# Patient Record
Sex: Female | Born: 1955 | Race: Black or African American | Hispanic: No | Marital: Married | Smoking: Never smoker
Health system: Southern US, Community
[De-identification: ages and names within clinical notes are randomized; demographics above are authoritative.]

## PROBLEM LIST (undated history)

## (undated) DIAGNOSIS — R42 Dizziness and giddiness: Secondary | ICD-10-CM

## (undated) DIAGNOSIS — I10 Essential (primary) hypertension: Secondary | ICD-10-CM

## (undated) DIAGNOSIS — E119 Type 2 diabetes mellitus without complications: Secondary | ICD-10-CM

## (undated) HISTORY — PX: HYSTERECTOMY ABDOMINAL WITH SALPINGO-OOPHORECTOMY: SHX6792

---

## 2003-08-25 ENCOUNTER — Ambulatory Visit (HOSPITAL_COMMUNITY): Admission: RE | Admit: 2003-08-25 | Discharge: 2003-08-25 | Payer: Self-pay | Admitting: *Deleted

## 2003-11-05 ENCOUNTER — Other Ambulatory Visit: Admission: RE | Admit: 2003-11-05 | Discharge: 2003-11-05 | Payer: Self-pay | Admitting: Family Medicine

## 2006-03-06 ENCOUNTER — Ambulatory Visit: Payer: Self-pay | Admitting: Family Medicine

## 2006-04-04 ENCOUNTER — Ambulatory Visit: Payer: Self-pay | Admitting: Family Medicine

## 2006-07-04 ENCOUNTER — Ambulatory Visit: Payer: Self-pay | Admitting: Family Medicine

## 2006-07-09 ENCOUNTER — Ambulatory Visit: Payer: Self-pay | Admitting: Family Medicine

## 2006-07-09 LAB — CONVERTED CEMR LAB
BUN: 9 mg/dL (ref 6–23)
Calcium: 9.2 mg/dL (ref 8.4–10.5)
Creatinine, Ser: 0.9 mg/dL (ref 0.4–1.2)
Glucose, Bld: 93 mg/dL (ref 70–99)
Potassium: 3.9 meq/L (ref 3.5–5.1)

## 2006-07-17 ENCOUNTER — Ambulatory Visit: Payer: Self-pay | Admitting: Family Medicine

## 2006-08-07 ENCOUNTER — Ambulatory Visit: Payer: Self-pay | Admitting: Family Medicine

## 2006-08-07 LAB — CONVERTED CEMR LAB
AST: 21 units/L (ref 0–37)
BUN: 11 mg/dL (ref 6–23)
Chol/HDL Ratio, serum: 6.3
Cholesterol: 218 mg/dL (ref 0–200)
GFR calc non Af Amer: 63 mL/min
Glucose, Bld: 118 mg/dL — ABNORMAL HIGH (ref 70–99)
HDL: 34.4 mg/dL — ABNORMAL LOW (ref >39.0)
Potassium: 3.7 meq/L (ref 3.5–5.1)
Sodium: 138 meq/L (ref 135–145)
Triglyceride fasting, serum: 104 mg/dL (ref 0–149)
VLDL: 21 mg/dL (ref 0–40)

## 2006-09-18 DIAGNOSIS — I10 Essential (primary) hypertension: Secondary | ICD-10-CM | POA: Insufficient documentation

## 2006-09-24 ENCOUNTER — Ambulatory Visit: Payer: Self-pay | Admitting: Family Medicine

## 2006-10-10 ENCOUNTER — Encounter: Admission: RE | Admit: 2006-10-10 | Discharge: 2007-01-08 | Payer: Self-pay | Admitting: Family Medicine

## 2006-11-21 ENCOUNTER — Ambulatory Visit: Payer: Self-pay | Admitting: Family Medicine

## 2006-11-21 LAB — CONVERTED CEMR LAB
AST: 14 units/L (ref 0–37)
Glucose, Bld: 102 mg/dL — ABNORMAL HIGH (ref 70–99)
HDL: 34.3 mg/dL — ABNORMAL LOW (ref >39.0)
Total CHOL/HDL Ratio: 5.2
Triglycerides: 170 mg/dL — ABNORMAL HIGH (ref 0–149)
VLDL: 34 mg/dL (ref 0–40)

## 2006-12-25 ENCOUNTER — Ambulatory Visit: Payer: Self-pay | Admitting: Family Medicine

## 2007-01-25 ENCOUNTER — Emergency Department (HOSPITAL_COMMUNITY): Admission: EM | Admit: 2007-01-25 | Discharge: 2007-01-25 | Payer: Self-pay | Admitting: Emergency Medicine

## 2007-03-27 ENCOUNTER — Ambulatory Visit: Payer: Self-pay | Admitting: Family Medicine

## 2007-03-27 DIAGNOSIS — E785 Hyperlipidemia, unspecified: Secondary | ICD-10-CM | POA: Insufficient documentation

## 2007-03-28 ENCOUNTER — Telehealth (INDEPENDENT_AMBULATORY_CARE_PROVIDER_SITE_OTHER): Payer: Self-pay | Admitting: *Deleted

## 2007-03-28 LAB — CONVERTED CEMR LAB
BUN: 11 mg/dL (ref 6–23)
Chloride: 106 meq/L (ref 96–112)
Cholesterol: 214 mg/dL (ref 0–200)
GFR calc Af Amer: 98 mL/min
Glucose, Bld: 108 mg/dL — ABNORMAL HIGH (ref 70–99)
HDL: 28.3 mg/dL — ABNORMAL LOW (ref >39.0)
Potassium: 4.2 meq/L (ref 3.5–5.1)
Triglycerides: 151 mg/dL — ABNORMAL HIGH (ref 0–149)

## 2007-05-26 ENCOUNTER — Ambulatory Visit: Payer: Self-pay | Admitting: Family Medicine

## 2007-05-27 ENCOUNTER — Telehealth (INDEPENDENT_AMBULATORY_CARE_PROVIDER_SITE_OTHER): Payer: Self-pay | Admitting: *Deleted

## 2007-05-27 LAB — CONVERTED CEMR LAB
ALT: 14 units/L (ref 0–35)
Total CHOL/HDL Ratio: 6.8
Triglycerides: 197 mg/dL — ABNORMAL HIGH (ref 0–149)

## 2007-05-28 ENCOUNTER — Telehealth (INDEPENDENT_AMBULATORY_CARE_PROVIDER_SITE_OTHER): Payer: Self-pay | Admitting: *Deleted

## 2007-06-03 ENCOUNTER — Telehealth (INDEPENDENT_AMBULATORY_CARE_PROVIDER_SITE_OTHER): Payer: Self-pay | Admitting: *Deleted

## 2007-06-26 ENCOUNTER — Other Ambulatory Visit: Admission: RE | Admit: 2007-06-26 | Discharge: 2007-06-26 | Payer: Self-pay | Admitting: Family Medicine

## 2007-06-26 ENCOUNTER — Ambulatory Visit: Payer: Self-pay | Admitting: Family Medicine

## 2007-06-26 ENCOUNTER — Encounter (INDEPENDENT_AMBULATORY_CARE_PROVIDER_SITE_OTHER): Payer: Self-pay | Admitting: Family Medicine

## 2007-06-26 ENCOUNTER — Encounter (INDEPENDENT_AMBULATORY_CARE_PROVIDER_SITE_OTHER): Payer: Self-pay | Admitting: *Deleted

## 2007-06-26 DIAGNOSIS — N959 Unspecified menopausal and perimenopausal disorder: Secondary | ICD-10-CM | POA: Insufficient documentation

## 2007-07-03 ENCOUNTER — Telehealth (INDEPENDENT_AMBULATORY_CARE_PROVIDER_SITE_OTHER): Payer: Self-pay | Admitting: *Deleted

## 2007-07-03 LAB — CONVERTED CEMR LAB
ALT: 18 units/L (ref 0–35)
BUN: 11 mg/dL (ref 6–23)
Basophils Absolute: 0.1 10*3/uL (ref 0.0–0.1)
CO2: 29 meq/L (ref 19–32)
Calcium: 9.4 mg/dL (ref 8.4–10.5)
Chloride: 104 meq/L (ref 96–112)
Creatinine, Ser: 0.8 mg/dL (ref 0.4–1.2)
Eosinophils Absolute: 0.2 10*3/uL (ref 0.0–0.6)
Eosinophils Relative: 2.6 % (ref 0.0–5.0)
FSH: 35.3 milliintl units/mL
GFR calc non Af Amer: 81 mL/min
Glucose, Bld: 101 mg/dL — ABNORMAL HIGH (ref 70–99)
HCT: 36.8 % (ref 36.0–46.0)
LH: 16.9 milliintl units/mL
MCHC: 33 g/dL (ref 30.0–36.0)
Monocytes Absolute: 0.7 10*3/uL (ref 0.2–0.7)
Monocytes Relative: 9.5 % (ref 3.0–11.0)
Neutro Abs: 3.4 10*3/uL (ref 1.4–7.7)
RBC: 4.84 M/uL (ref 3.87–5.11)
TSH: 4.08 microintl units/mL (ref 0.35–5.50)
WBC: 7.2 10*3/uL (ref 4.5–10.5)

## 2007-08-14 ENCOUNTER — Encounter (INDEPENDENT_AMBULATORY_CARE_PROVIDER_SITE_OTHER): Payer: Self-pay | Admitting: Family Medicine

## 2007-09-02 ENCOUNTER — Telehealth (INDEPENDENT_AMBULATORY_CARE_PROVIDER_SITE_OTHER): Payer: Self-pay | Admitting: *Deleted

## 2007-11-07 ENCOUNTER — Ambulatory Visit: Payer: Self-pay | Admitting: Family Medicine

## 2007-11-09 LAB — CONVERTED CEMR LAB
ALT: 18 units/L (ref 0–35)
Calcium: 9.1 mg/dL (ref 8.4–10.5)
GFR calc non Af Amer: 70 mL/min
Glucose, Bld: 95 mg/dL (ref 70–99)
HDL: 30.8 mg/dL — ABNORMAL LOW (ref >39.0)
Potassium: 3.9 meq/L (ref 3.5–5.1)
Total CHOL/HDL Ratio: 6.7

## 2007-11-10 ENCOUNTER — Telehealth (INDEPENDENT_AMBULATORY_CARE_PROVIDER_SITE_OTHER): Payer: Self-pay | Admitting: *Deleted

## 2008-04-22 ENCOUNTER — Ambulatory Visit: Payer: Self-pay | Admitting: Family Medicine

## 2008-04-22 DIAGNOSIS — J309 Allergic rhinitis, unspecified: Secondary | ICD-10-CM | POA: Insufficient documentation

## 2008-05-04 LAB — CONVERTED CEMR LAB
AST: 19 units/L (ref 0–37)
Bilirubin, Direct: 0.1 mg/dL (ref 0.0–0.3)
Calcium: 9.3 mg/dL (ref 8.4–10.5)
Cholesterol: 205 mg/dL (ref 0–200)
Direct LDL: 133.6 mg/dL
GFR calc Af Amer: 85 mL/min
GFR calc non Af Amer: 70 mL/min
Glucose, Bld: 114 mg/dL — ABNORMAL HIGH (ref 70–99)
Total Bilirubin: 0.4 mg/dL (ref 0.3–1.2)
Total Protein: 8.4 g/dL — ABNORMAL HIGH (ref 6.0–8.3)

## 2009-01-11 ENCOUNTER — Ambulatory Visit: Payer: Self-pay | Admitting: Family Medicine

## 2009-01-11 DIAGNOSIS — N950 Postmenopausal bleeding: Secondary | ICD-10-CM

## 2009-01-11 LAB — CONVERTED CEMR LAB: Beta hcg, urine, semiquantitative: NEGATIVE

## 2009-01-14 ENCOUNTER — Encounter: Admission: RE | Admit: 2009-01-14 | Discharge: 2009-01-14 | Payer: Self-pay | Admitting: Family Medicine

## 2009-01-16 DIAGNOSIS — D259 Leiomyoma of uterus, unspecified: Secondary | ICD-10-CM | POA: Insufficient documentation

## 2009-01-16 LAB — CONVERTED CEMR LAB
HCT: 33.2 % — ABNORMAL LOW (ref 36.0–46.0)
MCV: 77.3 fL — ABNORMAL LOW (ref 78.0–100.0)
RBC: 4.3 M/uL (ref 3.87–5.11)
RDW: 14.2 % (ref 11.5–14.6)
WBC: 8.7 10*3/uL (ref 4.5–10.5)

## 2009-01-17 ENCOUNTER — Encounter (INDEPENDENT_AMBULATORY_CARE_PROVIDER_SITE_OTHER): Payer: Self-pay | Admitting: *Deleted

## 2009-01-17 ENCOUNTER — Telehealth (INDEPENDENT_AMBULATORY_CARE_PROVIDER_SITE_OTHER): Payer: Self-pay | Admitting: *Deleted

## 2009-02-17 ENCOUNTER — Telehealth (INDEPENDENT_AMBULATORY_CARE_PROVIDER_SITE_OTHER): Payer: Self-pay | Admitting: *Deleted

## 2009-10-27 ENCOUNTER — Ambulatory Visit: Payer: Self-pay | Admitting: Family Medicine

## 2009-10-27 DIAGNOSIS — R011 Cardiac murmur, unspecified: Secondary | ICD-10-CM

## 2009-11-10 ENCOUNTER — Ambulatory Visit: Payer: Self-pay | Admitting: Family Medicine

## 2009-11-10 DIAGNOSIS — D649 Anemia, unspecified: Secondary | ICD-10-CM | POA: Insufficient documentation

## 2009-11-15 ENCOUNTER — Ambulatory Visit: Payer: Self-pay | Admitting: Cardiology

## 2009-11-15 ENCOUNTER — Ambulatory Visit: Payer: Self-pay

## 2009-11-15 ENCOUNTER — Encounter: Payer: Self-pay | Admitting: Family Medicine

## 2009-11-15 ENCOUNTER — Ambulatory Visit (HOSPITAL_COMMUNITY): Admission: RE | Admit: 2009-11-15 | Discharge: 2009-11-15 | Payer: Self-pay | Admitting: Family Medicine

## 2009-11-16 ENCOUNTER — Telehealth: Payer: Self-pay | Admitting: Family Medicine

## 2009-11-16 LAB — CONVERTED CEMR LAB
BUN: 13 mg/dL (ref 6–23)
Basophils Absolute: 0.1 10*3/uL (ref 0.0–0.1)
CO2: 30 meq/L (ref 19–32)
Cholesterol: 214 mg/dL — ABNORMAL HIGH (ref 0–200)
Creatinine,U: 241.3 mg/dL
Eosinophils Absolute: 0.2 10*3/uL (ref 0.0–0.7)
Folate: 5.1 ng/mL
GFR calc non Af Amer: 66.84 mL/min (ref >60)
HCT: 38.5 % (ref 36.0–46.0)
Lymphs Abs: 2.9 10*3/uL (ref 0.7–4.0)
MCHC: 31.9 g/dL (ref 30.0–36.0)
MCV: 78 fL (ref 78.0–100.0)
Monocytes Absolute: 0.6 10*3/uL (ref 0.1–1.0)
Monocytes Relative: 8.6 % (ref 3.0–12.0)
Neutrophils Relative %: 49 % (ref 43.0–77.0)
RDW: 14.2 % (ref 11.5–14.6)
TSH: 3.5 microintl units/mL (ref 0.35–5.50)
Total Bilirubin: 0.3 mg/dL (ref 0.3–1.2)
Total Protein: 8.5 g/dL — ABNORMAL HIGH (ref 6.0–8.3)
Triglycerides: 173 mg/dL — ABNORMAL HIGH (ref 0.0–149.0)
VLDL: 34.6 mg/dL (ref 0.0–40.0)

## 2010-02-06 ENCOUNTER — Ambulatory Visit: Payer: Self-pay | Admitting: Diagnostic Radiology

## 2010-02-06 ENCOUNTER — Emergency Department (HOSPITAL_BASED_OUTPATIENT_CLINIC_OR_DEPARTMENT_OTHER): Admission: EM | Admit: 2010-02-06 | Discharge: 2010-02-07 | Payer: Self-pay | Admitting: Emergency Medicine

## 2010-02-10 ENCOUNTER — Ambulatory Visit: Payer: Self-pay | Admitting: Family Medicine

## 2010-02-10 DIAGNOSIS — M546 Pain in thoracic spine: Secondary | ICD-10-CM | POA: Insufficient documentation

## 2010-02-10 LAB — CONVERTED CEMR LAB
Bilirubin Urine: NEGATIVE
Glucose, Urine, Semiquant: NEGATIVE
Ketones, urine, test strip: NEGATIVE
Protein, U semiquant: NEGATIVE
Specific Gravity, Urine: 1.02

## 2010-02-13 LAB — CONVERTED CEMR LAB
AST: 18 units/L (ref 0–37)
CO2: 29 meq/L (ref 19–32)
Cholesterol: 203 mg/dL — ABNORMAL HIGH (ref 0–200)
Direct LDL: 148.2 mg/dL
Ferritin: 37.6 ng/mL (ref 10.0–291.0)
Glucose, Bld: 117 mg/dL — ABNORMAL HIGH (ref 70–99)
Potassium: 4 meq/L (ref 3.5–5.1)
Saturation Ratios: 12.5 % — ABNORMAL LOW (ref 20.0–50.0)
Sodium: 141 meq/L (ref 135–145)
Total Bilirubin: 0.4 mg/dL (ref 0.3–1.2)
Total CHOL/HDL Ratio: 6
Total Protein: 8 g/dL (ref 6.0–8.3)
Transferrin: 228 mg/dL (ref 212.0–360.0)
Triglycerides: 187 mg/dL — ABNORMAL HIGH (ref 0.0–149.0)
VLDL: 37.4 mg/dL (ref 0.0–40.0)

## 2010-09-28 NOTE — Progress Notes (Signed)
Summary: Lab Results   Phone Note Outgoing Call   Call placed by: Army Fossa CMA,  November 16, 2009 2:48 PM Reason for Call: Discuss lab or test results Summary of Call: Regarding lab results, LMTCB:  Pt needs cholesterol medication---she has been on vytorin, zocor, crestor but stopped all of them---why? Signed by Loreen Freud DO on 11/16/2009 at 2:45 PM   Follow-up for Phone Call        Pt states that she just ran out of refills, which medication would like to start her on. Pt is aware it will be sent to [facility] on [address]. Army Fossa CMA  November 16, 2009 3:25 PM   Additional Follow-up for Phone Call Additional follow up Details #1::        Crestor 10 mg #30  1 by mouth at bedtime,  2 refills---recheck labs in 3 months---272.4  lipid, hep Additional Follow-up by: Loreen Freud DO,  November 16, 2009 8:37 PM    Additional Follow-up for Phone Call Additional follow up Details #2::    Pt is aware. Army Fossa CMA  November 17, 2009 9:18 AM   New/Updated Medications: CRESTOR 10 MG TABS (ROSUVASTATIN CALCIUM) 1 by mouth at bedtime. Prescriptions: CRESTOR 10 MG TABS (ROSUVASTATIN CALCIUM) 1 by mouth at bedtime.  #30 x 2   Entered by:   Army Fossa CMA   Authorized by:   Loreen Freud DO   Signed by:   Army Fossa CMA on 11/17/2009   Method used:   Electronically to        [facility] [address] (retail)       [phone]       [place], [place]  [address]       Ph: [phone]       Fax: [phone]   RxID:   [id number]

## 2010-09-28 NOTE — Assessment & Plan Note (Signed)
Summary: 2 WEEK FOLLOWUP AND FASTING LABS///[facility]   Vital Signs:  Patient profile:   55 year old female Weight:      231 pounds Temp:     98.7 degrees F oral Pulse rate:   76 / minute Pulse rhythm:   regular BP sitting:   126 / 84  (left arm) Cuff size:   large  Vitals Entered By: Army Fossa CMA (November 10, 2009 8:19 AM) CC: follow up on BP, and labs   History of Present Illness:  Hypertension follow-up      This is a 55 year old woman who presents for Hypertension follow-up.  The patient denies lightheadedness, urinary frequency, headaches, edema, impotence, rash, and fatigue.  The patient denies the following associated symptoms: chest pain, chest pressure, exercise intolerance, dyspnea, palpitations, syncope, leg edema, and pedal edema.  Compliance with medications (by patient report) has been near 100%.  The patient reports that dietary compliance has been good.  Adjunctive measures currently used by the patient include salt restriction.    Hyperlipidemia follow-up      The patient also presents for Hyperlipidemia follow-up.  The patient denies muscle aches, GI upset, abdominal pain, flushing, itching, constipation, diarrhea, and fatigue.  The patient denies the following symptoms: chest pain/pressure, exercise intolerance, dypsnea, palpitations, syncope, and pedal edema.  Compliance with medications (by patient report) has been near 100%.  Dietary compliance has been good.  Adjunctive measures currently used by the patient include ASA, limiting alcohol consumpton, and weight reduction.    Current Medications (verified): 1)  Diovan Hct 320-12.5 Mg Tabs (Valsartan-Hydrochlorothiazide) .Marland Kitchen.. 1 By Mouth Once Daily 2)  Astepro 137 Mcg/spray Soln (Azelastine Hcl) .... 2 Sprays Each Nostril Two Times A Day  Allergies (verified): No Known Drug Allergies  Past History:  Past medical, surgical, family and social histories (including risk factors) reviewed for relevance to current acute  and chronic problems.  Past Medical History: Reviewed history from 03/27/2007 and no changes required. Hypertension Hyperlipidemia  Past Surgical History: Reviewed history from 06/26/2007 and no changes required. Caesarean section Tubal ligation  Family History: Reviewed history from 06/26/2007 and no changes required. Family History Diabetes 1st degree relative Family History High cholesterol Family History Hypertension  Social History: Reviewed history from 06/26/2007 and no changes required. Married Never Smoked Alcohol use-no Drug use-no Regular exercise-no  Review of Systems      See HPI  Physical Exam  General:  Well-developed,well-nourished,in no acute distress; alert,appropriate and cooperative throughout examination Neck:  No deformities, masses, or tenderness noted. Lungs:  Normal respiratory effort, chest expands symmetrically. Lungs are clear to auscultation, no crackles or wheezes. Heart:  normal rate and Grade 2  /6 systolic ejection murmur.   Extremities:  No clubbing, cyanosis, edema, or deformity noted with normal full range of motion of all joints.   Skin:  Intact without suspicious lesions or rashes Psych:  Cognition and judgment appear intact. Alert and cooperative with normal attention span and concentration. No apparent delusions, illusions, hallucinations   Impression & Recommendations:  Problem # 1:  HYPERLIPIDEMIA (ICD-272.4)  Orders: Venipuncture (04540) TLB-B12 + Folate Pnl (98119_14782-N56/OZH) TLB-Lipid Panel (80061-LIPID) TLB-BMP (Basic Metabolic Panel-BMET) (80048-METABOL) TLB-CBC Platelet - w/Differential (85025-CBCD) TLB-Hepatic/Liver Function Pnl (80076-HEPATIC) TLB-TSH (Thyroid Stimulating Hormone) (84443-TSH) TLB-IBC Pnl (Iron/FE;Transferrin) (83550-IBC) TLB-A1C / Hgb A1C (Glycohemoglobin) (83036-A1C) TLB-Ferritin (82728-FER) TLB-Microalbumin/Creat Ratio, Urine (82043-MALB)  Labs Reviewed: SGOT: 19 (04/22/2008)   SGPT: 22  (04/22/2008)   HDL:32.8 (04/22/2008), 30.8 (11/07/2007)  LDL:DEL (04/22/2008), DEL (11/07/2007)  Chol:205 (04/22/2008),  207 (11/07/2007)  Trig:152 (04/22/2008), 164 (11/07/2007)  Problem # 2:  HYPERTENSION (ICD-401.9)  Her updated medication list for this problem includes:    Diovan Hct 320-12.5 Mg Tabs (Valsartan-hydrochlorothiazide) .Marland Kitchen... 1 by mouth once daily  Orders: Venipuncture (40981) TLB-B12 + Folate Pnl (19147_82956-O13/YQM) TLB-Lipid Panel (80061-LIPID) TLB-BMP (Basic Metabolic Panel-BMET) (80048-METABOL) TLB-CBC Platelet - w/Differential (85025-CBCD) TLB-Hepatic/Liver Function Pnl (80076-HEPATIC) TLB-TSH (Thyroid Stimulating Hormone) (84443-TSH) TLB-IBC Pnl (Iron/FE;Transferrin) (83550-IBC) TLB-A1C / Hgb A1C (Glycohemoglobin) (83036-A1C) TLB-Ferritin (82728-FER) TLB-Microalbumin/Creat Ratio, Urine (82043-MALB)  BP today: 126/84 Prior BP: 150/90 (10/27/2009)  Labs Reviewed: K+: 4.2 (04/22/2008) Creat: : 0.9 (04/22/2008)   Chol: 205 (04/22/2008)   HDL: 32.8 (04/22/2008)   LDL: DEL (04/22/2008)   TG: 152 (04/22/2008)  Problem # 3:  UNSPECIFIED ANEMIA (ICD-285.9)  Orders: Venipuncture (57846) TLB-B12 + Folate Pnl (96295_28413-K44/WNU) TLB-Lipid Panel (80061-LIPID) TLB-BMP (Basic Metabolic Panel-BMET) (80048-METABOL) TLB-CBC Platelet - w/Differential (85025-CBCD) TLB-Hepatic/Liver Function Pnl (80076-HEPATIC) TLB-TSH (Thyroid Stimulating Hormone) (84443-TSH) TLB-IBC Pnl (Iron/FE;Transferrin) (83550-IBC) TLB-A1C / Hgb A1C (Glycohemoglobin) (83036-A1C) TLB-Ferritin (82728-FER) TLB-Microalbumin/Creat Ratio, Urine (82043-MALB)  Hgb: 11.0 (01/11/2009)   Hct: 33.2 (01/11/2009)   Platelets: 463.0 (01/11/2009) RBC: 4.30 (01/11/2009)   RDW: 14.2 (01/11/2009)   WBC: 8.7 (01/11/2009) MCV: 77.3 (01/11/2009)   MCHC: 33.0 (01/11/2009) TSH: 4.08 (06/26/2007)  Complete Medication List: 1)  Diovan Hct 320-12.5 Mg Tabs (Valsartan-hydrochlorothiazide) .Marland Kitchen.. 1 by mouth once  daily 2)  Astepro 137 Mcg/spray Soln (Azelastine hcl) .... 2 sprays each nostril two times a day  Patient Instructions: 1)  rto 3 months or sooner as needed  Prescriptions: DIOVAN HCT 320-12.5 MG TABS (VALSARTAN-HYDROCHLOROTHIAZIDE) 1 by mouth once daily  #90 x 3   Entered and Authorized by:   Loreen Freud DO   Signed by:   Loreen Freud DO on 11/10/2009   Method used:   Faxed to ...       [facility] (mail-order)       [address] [place], [place]  [address]       Ph: [phone]       Fax: [phone]   RxID:   [id number] DIOVAN HCT 320-12.5 MG TABS (VALSARTAN-HYDROCHLOROTHIAZIDE) 1 by mouth once daily  #30 x 0   Entered and Authorized by:   Loreen Freud DO   Signed by:   Loreen Freud DO on 11/10/2009   Method used:   Electronically to        [facility] [address] (retail)       [phone]       [place], [place]  [address]       Ph: [phone]       Fax: [phone]   RxID:   [id number] BENICAR HCT 40-12.5 MG TABS (OLMESARTAN MEDOXOMIL-HCTZ) take one tablet daily  #30 x 0   Entered and Authorized by:   Loreen Freud DO   Signed by:   Loreen Freud DO on 11/10/2009   Method used:   Electronically to        [facility] [address] (retail)       [phone]       [place], [place]  [address]       Ph: [phone]       Fax: [phone]   RxID:   [id number]

## 2010-09-28 NOTE — Assessment & Plan Note (Signed)
Summary: 3 MTH FU/KDC   Vital Signs:  Patient profile:   55 year old female Weight:      237 pounds Pulse rate:   78 / minute Pulse rhythm:   regular BP sitting:   124 / 86  (left arm) Cuff size:   large  Vitals Entered By: Army Fossa CMA (February 10, 2010 9:42 AM) CC: Pt here for 3 month follow up, also went to the ER on monday for upper back pain., Back Pain   History of Present Illness:       This is a 55 year old woman who presents with Back Pain.  The symptoms began 6 days ago.  Pt states upper back pain started last Sunday and Monday it worsened and radiated to chest.  Pt went to ER.  Pt given pain med and nsaid --pt states it is better now.    No known injury.  The patient denies fever, chills, weakness, loss of sensation, fecal incontinence, urinary incontinence, urinary retention, dysuria, rest pain, inability to work, and inability to care for self.  The pain is located in the right thoracic back.  The pain began at home and gradually.  The pain is made worse by activity.  The pain is made better by inactivity, NSAID medications, opioids, and heat.    Hyperlipidemia follow-up      The patient also presents for Hyperlipidemia follow-up.  The patient denies muscle aches, GI upset, abdominal pain, flushing, itching, constipation, diarrhea, and fatigue.  The patient denies the following symptoms: chest pain/pressure, exercise intolerance, dypsnea, palpitations, syncope, and pedal edema.  Compliance with medications (by patient report) has been near 100%.  Dietary compliance has been fair.  The patient reports exercising occasionally.    Hypertension follow-up      The patient also presents for Hypertension follow-up.  The patient denies lightheadedness, urinary frequency, headaches, edema, impotence, rash, and fatigue.  The patient denies the following associated symptoms: chest pain, chest pressure, exercise intolerance, dyspnea, palpitations, syncope, leg edema, and pedal edema.   Compliance with medications (by patient report) has been near 100%.  The patient reports that dietary compliance has been fair.  The patient reports exercising occasionally.  Adjunctive measures currently used by the patient include salt restriction.    Preventive Screening-Counseling & Management  Alcohol-Tobacco     Smoking Status: never  Caffeine-Diet-Exercise     Does Patient Exercise: no  Current Medications (verified): 1)  Diovan Hct 320-12.5 Mg Tabs (Valsartan-Hydrochlorothiazide) .Marland Kitchen.. 1 By Mouth Once Daily 2)  Astepro 137 Mcg/spray Soln (Azelastine Hcl) .... 2 Sprays Each Nostril Two Times A Day 3)  Crestor 10 Mg Tabs (Rosuvastatin Calcium) .Marland Kitchen.. 1 By Mouth At Bedtime.  Allergies (verified): No Known Drug Allergies  Past History:  Past medical, surgical, family and social histories (including risk factors) reviewed for relevance to current acute and chronic problems.  Past Medical History: Reviewed history from 03/27/2007 and no changes required. Hypertension Hyperlipidemia  Past Surgical History: Reviewed history from 06/26/2007 and no changes required. Caesarean section Tubal ligation  Family History: Reviewed history from 06/26/2007 and no changes required. Family History Diabetes 1st degree relative Family History High cholesterol Family History Hypertension  Social History: Reviewed history from 06/26/2007 and no changes required. Married Never Smoked Alcohol use-no Drug use-no Regular exercise-no  Review of Systems      See HPI  Physical Exam  General:  Well-developed,well-nourished,in no acute distress; alert,appropriate and cooperative throughout examination Lungs:  Normal respiratory effort, chest expands  symmetrically. Lungs are clear to auscultation, no crackles or wheezes. Heart:  normal rate and no murmur.   Msk:  normal ROM and no joint tenderness.   Extremities:  No clubbing, cyanosis, edema, or deformity noted with normal full range of  motion of all joints.   Neurologic:  alert & oriented X3, strength normal in all extremities, gait normal, and DTRs symmetrical and normal.   Skin:  Intact without suspicious lesions or rashes Psych:  Oriented X3 and normally interactive.     Impression & Recommendations:  Problem # 1:  HYPERLIPIDEMIA (ICD-272.4)  Her updated medication list for this problem includes:    Crestor 10 Mg Tabs (Rosuvastatin calcium) .Marland Kitchen... 1 by mouth at bedtime.  Orders: Venipuncture (04540) TLB-Lipid Panel (80061-LIPID) TLB-BMP (Basic Metabolic Panel-BMET) (80048-METABOL) TLB-Hepatic/Liver Function Pnl (80076-HEPATIC) TLB-IBC Pnl (Iron/FE;Transferrin) (83550-IBC) TLB-B12 + Folate Pnl (98119_14782-N56/OZH) TLB-Ferritin (82728-FER)  Labs Reviewed: SGOT: 20 (11/10/2009)   SGPT: 21 (11/10/2009)   HDL:34.50 (11/10/2009), 32.8 (04/22/2008)  LDL:DEL (04/22/2008), DEL (11/07/2007)  Chol:214 (11/10/2009), 205 (04/22/2008)  Trig:173.0 (11/10/2009), 152 (04/22/2008)  Problem # 2:  HYPERTENSION (ICD-401.9)  Her updated medication list for this problem includes:    Diovan Hct 320-12.5 Mg Tabs (Valsartan-hydrochlorothiazide) .Marland Kitchen... 1 by mouth once daily  Orders: Venipuncture (08657) TLB-Lipid Panel (80061-LIPID) TLB-BMP (Basic Metabolic Panel-BMET) (80048-METABOL) TLB-Hepatic/Liver Function Pnl (80076-HEPATIC) TLB-IBC Pnl (Iron/FE;Transferrin) (83550-IBC) TLB-B12 + Folate Pnl (84696_29528-U13/KGM) TLB-Ferritin (82728-FER)  BP today: 124/86 Prior BP: 126/84 (11/10/2009)  Labs Reviewed: K+: 3.7 (11/10/2009) Creat: : 1.1 (11/10/2009)   Chol: 214 (11/10/2009)   HDL: 34.50 (11/10/2009)   LDL: DEL (04/22/2008)   TG: 173.0 (11/10/2009)  Problem # 3:  UNSPECIFIED ANEMIA (ICD-285.9)  Orders: Venipuncture (01027) TLB-Lipid Panel (80061-LIPID) TLB-BMP (Basic Metabolic Panel-BMET) (80048-METABOL) TLB-Hepatic/Liver Function Pnl (80076-HEPATIC) TLB-IBC Pnl (Iron/FE;Transferrin) (83550-IBC) TLB-B12 + Folate Pnl  (25366_44034-V42/VZD) TLB-Ferritin (82728-FER)  Hgb: 12.3 (11/10/2009)   Hct: 38.5 (11/10/2009)   Platelets: 423.0 (11/10/2009) RBC: 4.94 (11/10/2009)   RDW: 14.2 (11/10/2009)   WBC: 7.5 (11/10/2009) MCV: 78.0 (11/10/2009)   MCHC: 31.9 (11/10/2009) Ferritin: 29.0 (11/10/2009) Iron: 38 (11/10/2009)   % Sat: 11.9 (11/10/2009) B12: 397 (11/10/2009)   Folate: 5.1 (11/10/2009)   TSH: 3.50 (11/10/2009)  Problem # 4:  BACK PAIN, THORACIC REGION (ICD-724.1) Assessment: Improved  Discussed use of moist heat or ice, modified activities, medications, and stretching/strengthening exercises. Back care instructions given. To be seen in 2 weeks if no improvement; sooner if worsening of symptoms.   Complete Medication List: 1)  Diovan Hct 320-12.5 Mg Tabs (Valsartan-hydrochlorothiazide) .Marland Kitchen.. 1 by mouth once daily 2)  Astepro 137 Mcg/spray Soln (Azelastine hcl) .... 2 sprays each nostril two times a day 3)  Crestor 10 Mg Tabs (Rosuvastatin calcium) .Marland Kitchen.. 1 by mouth at bedtime.  Other Orders: Specimen Handling (63875) T-Culture, Urine (64332-95188) UA Dipstick w/o Micro (manual) [phone])  Laboratory Results   Urine Tests   Date/Time Reported: February 10, 2010 10:27 AM   Routine Urinalysis   Color: lt. yellow Appearance: Cloudy Glucose: negative   (Normal Range: Negative) Bilirubin: negative   (Normal Range: Negative) Ketone: negative   (Normal Range: Negative) Spec. Gravity: 1.020   (Normal Range: 1.003-1.035) Blood: moderate   (Normal Range: Negative) pH: 5.0   (Normal Range: 5.0-8.0) Protein: negative   (Normal Range: Negative) Urobilinogen: negative   (Normal Range: 0-1) Nitrite: negative   (Normal Range: Negative) Leukocyte Esterace: moderate   (Normal Range: Negative)    Comments: Floydene Flock  February 10, 2010 10:27 AM cx sent

## 2010-09-28 NOTE — Assessment & Plan Note (Signed)
Summary: Follow-up on B/P and Headaches/scm   Vital Signs:  Patient profile:   55 year old female Height:      64.5 inches Weight:      259 pounds BMI:     43.93 Pulse rate:   70 / minute BP sitting:   150 / 90  (left arm)  Vitals Entered By: Doristine Devoid (October 27, 2009 4:01 PM) CC: bp and HA xweeks mainly dull but has been constant   History of Present Illness:  Hypertension follow-up      This is a 55 year old woman who presents for Hypertension follow-up.  The patient complains of headaches, but denies lightheadedness, urinary frequency, edema, impotence, rash, and fatigue.  Associated symptoms include pedal edema.  The patient denies the following associated symptoms: chest pain, chest pressure, exercise intolerance, dyspnea, palpitations, syncope, and leg edema.  Compliance with medications (by patient report) has been near 100%.  The patient reports that dietary compliance has been good.  The patient reports exercising 3-4X per week.  Adjunctive measures currently used by the patient include salt restriction.    Current Medications (verified): 1)  Benicar Hct 40-12.5 Mg Tabs (Olmesartan Medoxomil-Hctz) .... Take One Tablet Daily 2)  Astepro 137 Mcg/spray Soln (Azelastine Hcl) .... 2 Sprays Each Nostril Two Times A Day  Allergies (verified): No Known Drug Allergies  Past History:  Past medical, surgical, family and social histories (including risk factors) reviewed for relevance to current acute and chronic problems.  Past Medical History: Reviewed history from 03/27/2007 and no changes required. Hypertension Hyperlipidemia  Past Surgical History: Reviewed history from 06/26/2007 and no changes required. Caesarean section Tubal ligation  Family History: Reviewed history from 06/26/2007 and no changes required. Family History Diabetes 1st degree relative Family History High cholesterol Family History Hypertension  Social History: Reviewed history from 06/26/2007  and no changes required. Married Never Smoked Alcohol use-no Drug use-no Regular exercise-no  Review of Systems      See HPI  Physical Exam  General:  Well-developed,well-nourished,in no acute distress; alert,appropriate and cooperative throughout examination Lungs:  Normal respiratory effort, chest expands symmetrically. Lungs are clear to auscultation, no crackles or wheezes. Heart:  normal rate and Grade  2 /6 systolic ejection murmur.   Extremities:  No clubbing, cyanosis, edema, or deformity noted with normal full range of motion of all joints.     Impression & Recommendations:  Problem # 1:  HYPERTENSION (ICD-401.9)  Her updated medication list for this problem includes:    Benicar Hct 40-12.5 Mg Tabs (Olmesartan medoxomil-hctz) .Marland Kitchen... Take one tablet daily  Orders: Echo Referral (Echo)  BP today: 150/90 Prior BP: 118/90 (01/11/2009)  Labs Reviewed: K+: 4.2 (04/22/2008) Creat: : 0.9 (04/22/2008)   Chol: 205 (04/22/2008)   HDL: 32.8 (04/22/2008)   LDL: DEL (04/22/2008)   TG: 152 (04/22/2008)  Complete Medication List: 1)  Benicar Hct 40-12.5 Mg Tabs (Olmesartan medoxomil-hctz) .... Take one tablet daily 2)  Astepro 137 Mcg/spray Soln (Azelastine hcl) .... 2 sprays each nostril two times a day  Patient Instructions: 1)  Please schedule a follow-up appointment in 2 weeks.  2)  Bring your BP cuff with you

## 2010-11-13 LAB — D-DIMER, QUANTITATIVE: D-Dimer, Quant: 0.33 ug/mL-FEU (ref 0.00–0.48)

## 2010-11-13 LAB — BASIC METABOLIC PANEL
BUN: 14 mg/dL (ref 6–23)
Calcium: 9.6 mg/dL (ref 8.4–10.5)
Chloride: 106 mEq/L (ref 96–112)
GFR calc Af Amer: >60 mL/min (ref >60)
Glucose, Bld: 119 mg/dL — ABNORMAL HIGH (ref 70–99)
Potassium: 3.7 mEq/L (ref 3.5–5.1)
Sodium: 148 mEq/L — ABNORMAL HIGH (ref 135–145)

## 2010-11-13 LAB — POCT CARDIAC MARKERS
Myoglobin, poc: 85.1 ng/mL (ref 12–200)
Troponin i, poc: <0.05 ng/mL (ref 0.00–0.09)

## 2010-11-13 LAB — CBC: HCT: 41.3 % (ref 36.0–46.0)

## 2010-11-13 LAB — DIFFERENTIAL
Basophils Absolute: 0.4 10*3/uL — ABNORMAL HIGH (ref 0.0–0.1)
Basophils Relative: 5 % — ABNORMAL HIGH (ref 0–1)
Neutrophils Relative %: 39 % — ABNORMAL LOW (ref 43–77)

## 2011-01-12 NOTE — Assessment & Plan Note (Signed)
[facility]                        [place] OFFICE NOTE   NAME:[name], [name] SIVERTSON                         MRN:          [id number]  DATE:09/24/2006                            DOB:          08/08/1956    REASON FOR VISIT:  Discuss labs.   [name] is a pleasant female with a history of hypertension, comes in  today to discuss her laboratory data.  She was recently found to have an  LDL of 174 with a goal less than 130.  Additionally she was noted to  have a blood sugar of 118 fasting.   MEDICATIONS:  Myocardia/hydrochlorothiazide 80/12.5.   OBJECTIVE:  Weight 227, blood pressure 146/90.  Repeat at the end of the  visit was 130/80.  LUNGS:  Were clear.  HEART:  Was regular rate and rhythm.  Normal S1, normal S2. No murmurs,  gallops or rubs.   IMPRESSION:  1. Hypertension with white coat syndrome.  2. Hyperlipidemia.  3. Hyperglycemia.   PLAN:  Had a long discussion regarding diet change and lifestyle.  These  were reviewed.  The patient has agreed to starting Zocor 40 mg q. day.  The prescription was already sent to the pharmacy.  She is to follow up  in 8 weeks to assess the lipids, AST, ALT, and fasting glucose.  1. The patient will also schedule an appointment for 3 months.  2. The patient will be referred to nutritionist for further assistance      of lifestyle changes.  3. The patient aware that if there is a significant change in her      blood sugar we will consider addition of Glucophage given her other      risk factors.  The patient expressed understanding.     Leanne Chang, M.D.  Electronically Signed    LA/MedQ  DD: 09/24/2006  DT: 09/24/2006  Job #: [id number]

## 2011-02-19 ENCOUNTER — Emergency Department (HOSPITAL_BASED_OUTPATIENT_CLINIC_OR_DEPARTMENT_OTHER)
Admission: EM | Admit: 2011-02-19 | Discharge: 2011-02-19 | Disposition: A | Discharge status: Home / Self Care | Payer: Managed Care, Other (non HMO) | Attending: Emergency Medicine | Admitting: Emergency Medicine

## 2011-02-19 DIAGNOSIS — M719 Bursopathy, unspecified: Secondary | ICD-10-CM | POA: Insufficient documentation

## 2011-02-19 DIAGNOSIS — M25519 Pain in unspecified shoulder: Secondary | ICD-10-CM | POA: Insufficient documentation

## 2011-02-19 DIAGNOSIS — I10 Essential (primary) hypertension: Secondary | ICD-10-CM | POA: Insufficient documentation

## 2011-02-19 DIAGNOSIS — M67919 Unspecified disorder of synovium and tendon, unspecified shoulder: Secondary | ICD-10-CM | POA: Insufficient documentation

## 2013-01-14 ENCOUNTER — Emergency Department (HOSPITAL_BASED_OUTPATIENT_CLINIC_OR_DEPARTMENT_OTHER)
Admission: EM | Admit: 2013-01-14 | Discharge: 2013-01-14 | Disposition: A | Discharge status: Home / Self Care | Payer: Managed Care, Other (non HMO) | Attending: Emergency Medicine | Admitting: Emergency Medicine

## 2013-01-14 ENCOUNTER — Encounter (HOSPITAL_BASED_OUTPATIENT_CLINIC_OR_DEPARTMENT_OTHER): Payer: Self-pay | Admitting: Family Medicine

## 2013-01-14 ENCOUNTER — Emergency Department (HOSPITAL_BASED_OUTPATIENT_CLINIC_OR_DEPARTMENT_OTHER): Payer: Managed Care, Other (non HMO)

## 2013-01-14 DIAGNOSIS — H539 Unspecified visual disturbance: Secondary | ICD-10-CM | POA: Insufficient documentation

## 2013-01-14 DIAGNOSIS — R110 Nausea: Secondary | ICD-10-CM | POA: Insufficient documentation

## 2013-01-14 DIAGNOSIS — M2550 Pain in unspecified joint: Secondary | ICD-10-CM | POA: Insufficient documentation

## 2013-01-14 DIAGNOSIS — R5383 Other fatigue: Secondary | ICD-10-CM | POA: Insufficient documentation

## 2013-01-14 DIAGNOSIS — R42 Dizziness and giddiness: Secondary | ICD-10-CM

## 2013-01-14 DIAGNOSIS — H579 Unspecified disorder of eye and adnexa: Secondary | ICD-10-CM | POA: Insufficient documentation

## 2013-01-14 DIAGNOSIS — I10 Essential (primary) hypertension: Secondary | ICD-10-CM | POA: Insufficient documentation

## 2013-01-14 DIAGNOSIS — R5381 Other malaise: Secondary | ICD-10-CM | POA: Insufficient documentation

## 2013-01-14 DIAGNOSIS — N390 Urinary tract infection, site not specified: Secondary | ICD-10-CM | POA: Insufficient documentation

## 2013-01-14 DIAGNOSIS — R6883 Chills (without fever): Secondary | ICD-10-CM | POA: Insufficient documentation

## 2013-01-14 DIAGNOSIS — R51 Headache: Secondary | ICD-10-CM | POA: Insufficient documentation

## 2013-01-14 DIAGNOSIS — R197 Diarrhea, unspecified: Secondary | ICD-10-CM | POA: Insufficient documentation

## 2013-01-14 HISTORY — DX: Essential (primary) hypertension: I10

## 2013-01-14 HISTORY — DX: Dizziness and giddiness: R42

## 2013-01-14 LAB — COMPREHENSIVE METABOLIC PANEL
AST: 15 U/L (ref 0–37)
Alkaline Phosphatase: 84 U/L (ref 39–117)
BUN: 13 mg/dL (ref 6–23)
CO2: 27 mEq/L (ref 19–32)
Chloride: 102 mEq/L (ref 96–112)
Creatinine, Ser: 0.8 mg/dL (ref 0.50–1.10)
GFR calc non Af Amer: 81 mL/min — ABNORMAL LOW (ref >90)
Potassium: 3.6 mEq/L (ref 3.5–5.1)
Total Bilirubin: 0.2 mg/dL — ABNORMAL LOW (ref 0.3–1.2)

## 2013-01-14 LAB — URINALYSIS, ROUTINE W REFLEX MICROSCOPIC
Bilirubin Urine: NEGATIVE
Glucose, UA: NEGATIVE mg/dL
Ketones, ur: NEGATIVE mg/dL
pH: 6 (ref 5.0–8.0)

## 2013-01-14 LAB — CBC WITH DIFFERENTIAL/PLATELET
Basophils Relative: 0 % (ref 0–1)
Hemoglobin: 13.1 g/dL (ref 12.0–15.0)
Lymphocytes Relative: 38 % (ref 12–46)
Lymphs Abs: 2.9 10*3/uL (ref 0.7–4.0)
MCH: 24.8 pg — ABNORMAL LOW (ref 26.0–34.0)
Monocytes Relative: 9 % (ref 3–12)
Neutro Abs: 3.9 10*3/uL (ref 1.7–7.7)
Neutrophils Relative %: 50 % (ref 43–77)
RBC: 5.29 MIL/uL — ABNORMAL HIGH (ref 3.87–5.11)
RDW: 15.4 % (ref 11.5–15.5)

## 2013-01-14 LAB — URINE MICROSCOPIC-ADD ON

## 2013-01-14 MED ORDER — SULFAMETHOXAZOLE-TRIMETHOPRIM 800-160 MG PO TABS: 1.0000 | ORAL_TABLET | Freq: Two times a day (BID) | ORAL | Status: DC

## 2013-01-14 MED ORDER — LORAZEPAM 1 MG PO TABS: 1.0000 mg | ORAL_TABLET | Freq: Three times a day (TID) | ORAL | Status: DC | PRN

## 2013-01-14 MED ORDER — SODIUM CHLORIDE 0.9 % IV BOLUS (SEPSIS)
1000.0000 mL | Freq: Once | INTRAVENOUS | Status: AC
  Administered 2013-01-14: 1000 mL via INTRAVENOUS

## 2013-01-14 MED ORDER — LORAZEPAM 2 MG/ML IJ SOLN
1.0000 mg | Freq: Once | INTRAMUSCULAR | Status: AC
  Administered 2013-01-14: 1 mg via INTRAVENOUS
  Filled 2013-01-14: qty 1

## 2013-01-14 MED ORDER — SULFAMETHOXAZOLE-TMP DS 800-160 MG PO TABS
1.0000 | ORAL_TABLET | Freq: Once | ORAL | Status: AC
  Administered 2013-01-14: 1 via ORAL
  Filled 2013-01-14: qty 1

## 2013-01-14 NOTE — ED Provider Notes (Signed)
History     CSN: [id number]  Arrival date & time 01/14/13  7829   First MD Initiated Contact with Patient 01/14/13 1002      Chief Complaint  Patient presents with  . Dizziness    (Consider location/radiation/quality/duration/timing/severity/associated sxs/prior treatment) HPI  Patient presents with multiple complaints. Primarily seems the patient's main complaint is disequilibrium with nausea.  This began yesterday.  Since onset symptoms of the persistent.  Symptoms are worse with motion, or head turning or wearing her glasses. Is associated nausea, but no vomiting. The patient also complains of diffuse arthralgia.  This began soon after the vertigo, and since onset has improved and lower extremities, but persists in the upper extremities. Beyond the arthralgia, there is no focal pain. The patient does complain of chills, denies dysuria, hematuria. She does endorse diarrhea. She denies abdominal pain, chest pain.  She states that she feels as though her blood pressure has been elevated during this episode.  Past Medical History  Diagnosis Date  . Hypertension   . Vertigo     Past Surgical History  Procedure Laterality Date  . Cesarean section      No family history on file.  History  Substance Use Topics  . Smoking status: Never Smoker   . Smokeless tobacco: Not on file  . Alcohol Use: No    OB History   Grav Para Term Preterm Abortions TAB SAB Ect Mult Living                  Review of Systems  Constitutional: Positive for chills and fatigue. Negative for fever and appetite change.  HENT: Negative for neck pain.   Eyes: Positive for discharge and visual disturbance. Negative for pain.  Respiratory: Negative.   Cardiovascular: Negative for chest pain.  Gastrointestinal: Negative for abdominal pain.  Endocrine: Negative for polyuria.  Genitourinary: Negative.   Musculoskeletal: Positive for arthralgias.  Skin: Negative for rash.  Neurological: Positive  for dizziness and headaches. Negative for tremors, seizures, syncope, facial asymmetry, speech difficulty, weakness, light-headedness and numbness.    Allergies  Review of patient's allergies indicates no known allergies.  Home Medications   Current Outpatient Rx  Name  Route  Sig  Dispense  Refill  . LOSARTAN POTASSIUM PO   Oral   Take by mouth.           BP 151/84  Pulse 77  Temp(Src) 97.8 F (36.6 C) (Oral)  Resp 18  Ht 5' 5.5" (1.664 m)  Wt 231 lb (104.781 kg)  BMI 37.84 kg/m2  SpO2 97%  Physical Exam  Nursing note and vitals reviewed. Constitutional: She is oriented to person, place, and time. She appears well-developed and well-nourished. No distress.  HENT:  Head: Normocephalic and atraumatic.  Eyes: Conjunctivae and EOM are normal.  Cardiovascular: Normal rate and regular rhythm.   Pulmonary/Chest: Effort normal and breath sounds normal. No stridor. No respiratory distress.  Abdominal: She exhibits no distension.  Musculoskeletal: She exhibits no edema.  Neurological: She is alert and oriented to person, place, and time. She displays no atrophy and no tremor. No cranial nerve deficit or sensory deficit. She exhibits normal muscle tone. She displays no seizure activity. Coordination normal.  Patient has mild, equal/symmetric slowing of finger-nose. Negative Romberg the  Skin: Skin is warm and dry.  Psychiatric: She has a normal mood and affect.    ED Course  Procedures (including critical care time)  Labs Reviewed  CBC WITH DIFFERENTIAL  COMPREHENSIVE METABOLIC PANEL  URINALYSIS, ROUTINE W REFLEX MICROSCOPIC   No results found.   No diagnosis found.  O2- 99%ra, normal   Date: 01/14/2013  Rate: 62  Rhythm: normal sinus rhythm  QRS Axis: normal  Intervals: normal  ST/T Wave abnormalities: nonspecific T wave changes  Conduction Disutrbances:none  Narrative Interpretation:   Old EKG Reviewed: none available  borderline  2:44 PM Patient  sleeping, seemingly comfortably. Vital signs are stable.   MDM  This generally well female presents with multiple complaints.  On exam she is awake and alert, appropriately interactive.  Patient's vital signs are stable.  Given her multitude of complaints, wide differential was considered. Patient's labs are largely reassuring, though there is suggestion of urinary tract infection. Following ED interventions the patient was significantly improved, resting comfortably Given the absence of notable abnormalities, the resolution of symptoms, absence of chest pain and significant dyspnea, any distress, abnormal vital signs, there is low suspicion for occult systemic infection, ACS, significant systemic pathology. The patient's evaluation is most consistent with a urinary tract infection. I discussed all results with the patient, she agreed to call her physician today to ensure appropriate ongoing care as an outpatient.  She was discharged in stable condition with return precautions.       Gerhard Munch, MD 01/14/13 [phone]

## 2013-01-14 NOTE — ED Notes (Signed)
Pt c/o dizziness "like vertigo" since yesterday and nausea. Pt also c/o blood pressure being elevated and chills.

## 2013-01-15 LAB — URINE CULTURE
Colony Count: NO GROWTH
Culture: NO GROWTH

## 2015-02-08 ENCOUNTER — Encounter (HOSPITAL_BASED_OUTPATIENT_CLINIC_OR_DEPARTMENT_OTHER): Payer: Self-pay

## 2015-02-08 ENCOUNTER — Emergency Department (HOSPITAL_BASED_OUTPATIENT_CLINIC_OR_DEPARTMENT_OTHER)
Admission: EM | Admit: 2015-02-08 | Discharge: 2015-02-08 | Disposition: A | Discharge status: Home / Self Care | Payer: BLUE CROSS/BLUE SHIELD | Attending: Emergency Medicine | Admitting: Emergency Medicine

## 2015-02-08 ENCOUNTER — Emergency Department (HOSPITAL_BASED_OUTPATIENT_CLINIC_OR_DEPARTMENT_OTHER): Payer: BLUE CROSS/BLUE SHIELD

## 2015-02-08 DIAGNOSIS — M7122 Synovial cyst of popliteal space [Baker], left knee: Secondary | ICD-10-CM

## 2015-02-08 DIAGNOSIS — I10 Essential (primary) hypertension: Secondary | ICD-10-CM | POA: Diagnosis not present

## 2015-02-08 DIAGNOSIS — M25562 Pain in left knee: Secondary | ICD-10-CM

## 2015-02-08 DIAGNOSIS — E119 Type 2 diabetes mellitus without complications: Secondary | ICD-10-CM | POA: Diagnosis not present

## 2015-02-08 DIAGNOSIS — M79605 Pain in left leg: Secondary | ICD-10-CM | POA: Insufficient documentation

## 2015-02-08 HISTORY — DX: Type 2 diabetes mellitus without complications: E11.9

## 2015-02-08 LAB — CBG MONITORING, ED: Glucose-Capillary: 94 mg/dL (ref 65–99)

## 2015-02-08 NOTE — ED Provider Notes (Signed)
CSN: [id number]     Arrival date & time 02/08/15  1603 History   First MD Initiated Contact with Patient 02/08/15 1656     Chief Complaint  Patient presents with  . Leg Pain     (Consider location/radiation/quality/duration/timing/severity/associated sxs/prior Treatment) The history is provided by the patient. No language interpreter was used.  [name] [name] is a 59 y/o F with PMHx of hypertension, vertigo, diabetes presenting to the ED with left leg pain has been ongoing for approximately 4 weeks. Patient reports that at first her pain started in her left ankle described as a throbbing pain, but has now moved to her knee. Reports that the pain is localized to the posterior aspect of her leg described as a throbbing pain, feels like her leg won't bend. Reported that it does radiate from the left buttock down to her left foot posteriorly. Stated that she has noticed some intermittent leg swelling that is now resolved. Patient reported that she has been experiencing lower back pain, localized to her hips bilaterally described as a dull aching sensation. Reported that she was seen and assessed by primary care provider approximate 3 weeks ago where an ultrasound was performed, but states she does not know the results because the physician did not tell her. Patient reported that she had a fall approximately 4 years ago, in the year 2012 where she fell down the steps. Reported that she was never seen regarding this injury. Denied recent fall, injury, weakness, numbness, tingling, travels, changes to skin colored, fever, chills, chest pain, shortness of breath, difficulty breathing, hemoptysis, surgery, urinary and bowel incontinence. PCP Dr. Coletta Memos   Past Medical History  Diagnosis Date  . Hypertension   . Vertigo   . Diabetes mellitus without complication    Past Surgical History  Procedure Laterality Date  . Cesarean section     No family history on file. History  Substance Use Topics  . Smoking  status: Never Smoker   . Smokeless tobacco: Not on file  . Alcohol Use: No   OB History    No data available     Review of Systems  Constitutional: Negative for fever and chills.  Respiratory: Negative for chest tightness and shortness of breath.   Cardiovascular: Negative for chest pain.  Musculoskeletal: Positive for back pain and arthralgias (left leg pain ).  Neurological: Negative for dizziness, weakness and numbness.      Allergies  Review of patient's allergies indicates no known allergies.  Home Medications   Prior to Admission medications   Medication Sig Start Date End Date Taking? Authorizing Provider  NAPROXEN PO Take by mouth.   Yes Historical Provider, MD  LOSARTAN POTASSIUM PO Take by mouth.    Historical Provider, MD   BP 135/52 mmHg  Pulse 80  Temp(Src) 97.8 F (36.6 C) (Oral)  Resp 20  Ht 5\' 5"  (1.651 m)  Wt 202 lb (91.627 kg)  BMI 33.61 kg/m2  SpO2 99% Physical Exam  Constitutional: She is oriented to person, place, and time. She appears well-developed and well-nourished.  HENT:  Head: Normocephalic and atraumatic.  Eyes: Conjunctivae and EOM are normal. Right eye exhibits no discharge. Left eye exhibits no discharge.  Neck: Normal range of motion. Neck supple.  Cardiovascular: Normal rate, regular rhythm and normal heart sounds.   Pulses:      Radial pulses are 2+ on the right side, and 2+ on the left side.       Dorsalis pedis pulses are 2+ on  the right side, and 2+ on the left side.  Negative swelling or pitting edema identified to the lower extremities bilaterally  Pulmonary/Chest: Effort normal and breath sounds normal. No respiratory distress. She has no wheezes. She has no rales.  Musculoskeletal: She exhibits tenderness. She exhibits no edema.  Negative deformities identified to left leg. Negative erythema, warmth upon palpation, ecchymosis, signs of malalignment or displacement. Full range of motion to the left hip, left knee, left ankle  and digits of the left foot without difficulty or ataxia. Mild tenderness upon palpation to the posterior aspect of the left leg entirely  Neurological: She is alert and oriented to person, place, and time. No cranial nerve deficit. She exhibits normal muscle tone. Coordination normal.  Cranial nerves III-XII grossly intact Strength 5+/5+ to upper and lower extremities bilaterally with resistance applied, equal distribution noted Equal grip strength bilaterally Negative saddle paresthesias Sensation intact with differentiation sharp and dull touch Negative leg drop Negative arm drift Fine motor skills intact  Skin: Skin is warm and dry. No rash noted. No erythema.  Psychiatric: She has a normal mood and affect. Her behavior is normal. Thought content normal.  Nursing note and vitals reviewed.   ED Course  Procedures (including critical care time)  Results for orders placed or performed during the hospital encounter of 02/08/15  CBG monitoring, ED  Result Value Ref Range   Glucose-Capillary 94 65 - 99 mg/dL    Labs Review Labs Reviewed  CBG MONITORING, ED    Imaging Review Dg Lumbar Spine Complete  02/08/2015   CLINICAL DATA:  Lumbar spine pain for 2 years. Worsening symptoms in the last 3 weeks. No recent injury.  EXAM: LUMBAR SPINE - COMPLETE 4+ VIEW  COMPARISON:  None.  FINDINGS: Mild to moderate levoconvex curve of the lumbar spine is present. Vertebral body height is preserved. Mild to moderate degenerative disc disease. Disc space loss is most pronounced at L3-L4. No fracture or acute osseous abnormality. Annular calcification incidentally noted at L4-L5 posteriorly, likely associated with disc degeneration and annular bulge.  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 No acute osseous abnormality.   Electronically Signed   By: Dereck Ligas M.D.   On: 02/08/2015 18:58   US Venous Img Lower Unilateral Left  02/08/2015    CLINICAL DATA:  Subacute onset of left popliteal pain for 3 weeks. Initial encounter.  EXAM: LEFT LOWER EXTREMITY VENOUS DOPPLER ULTRASOUND  TECHNIQUE: Gray-scale sonography with graded compression, as well as color Doppler and duplex ultrasound were performed to evaluate the lower extremity deep venous systems from the level of the common femoral vein and including the common femoral, femoral, profunda femoral, popliteal and calf veins including the posterior tibial, peroneal and gastrocnemius veins when visible. The superficial great saphenous vein was also interrogated. Spectral Doppler was utilized to evaluate flow at rest and with distal augmentation maneuvers in the common femoral, femoral and popliteal veins.  COMPARISON:  None.  FINDINGS: Contralateral Common Femoral Vein: Respiratory phasicity is normal and symmetric with the symptomatic side. No evidence of thrombus. Normal compressibility.  Common Femoral Vein: No evidence of thrombus. Normal compressibility, respiratory phasicity and response to augmentation.  Saphenofemoral Junction: No evidence of thrombus. Normal compressibility and flow on color Doppler imaging.  Profunda Femoral Vein: No evidence of thrombus. Normal compressibility and flow on color Doppler imaging.  Femoral Vein: No evidence of thrombus. Normal compressibility, respiratory phasicity and response to augmentation.  Popliteal Vein: No  evidence of thrombus. Normal compressibility, respiratory phasicity and response to augmentation.  Calf Veins: No evidence of thrombus. Normal compressibility and flow on color Doppler imaging.  Superficial Great Saphenous Vein: No evidence of thrombus. Normal compressibility and flow on color Doppler imaging.  Venous Reflux:  None.  Other Findings: A Baker's cyst is noted at the popliteal fossa, measuring 4.6 x 1.1 x 1.7 cm.  IMPRESSION: 1. No evidence of deep venous thrombosis. 2. Baker's cyst at the popliteal fossa, measuring 4.6 x 1.1 x 1.7 cm.    Electronically Signed   By: Garald Balding M.D.   On: 02/08/2015 18:54     EKG Interpretation None      MDM   Final diagnoses:  Left leg pain  Left knee pain  Baker's cyst, left    Medications - No data to display  Filed Vitals:   02/08/15 1611 02/08/15 2019  BP: 149/63 135/52  Pulse: 76 80  Temp: 97.8 F (36.6 C)   TempSrc: Oral   Resp: 18 20  Height: 5\' 5"  (1.651 m)   Weight: 202 lb (91.627 kg)   SpO2: 100% 99%   CBG 94. Plain film of lumbar spine noted mild to moderate levoconvex curve and mild to moderate lumbar degenerative disc disease. No acute osseous abnormalities identified. Lower extremity Doppler no evidence of DVT, Baker's cyst of the popliteal fossa measuring approximate 4.6 x 1.1 x 1.7 cm. Negative focal neurological deficits. Pulses palpable and strong. Sensation intact. Strength intact. Doubt cauda equina. Doubt epidural abscess. Negative findings of DVT. Suspicion to be Baker's cyst resulting in the left knee pain, cannot rule out possible sciatica. Patient stable, afebrile. Patient not septic appearing. Negative signs of respiratory distress. Discharged patient. Referred patient to PCP and orthopedics. Discussed with patient to rest, ice and elevate the left leg. Discussed with patient to avoid any physical or strenuous activity. Discussed with patient to closely monitor symptoms and if symptoms are to worsen or change to report back to the ED - strict return instructions given.  Patient agreed to plan of care, understood, all questions answered.   Jamse Mead, PA-C 02/08/15 2028  Virgel Manifold, MD 02/09/15 [id number]

## 2015-02-08 NOTE — ED Notes (Addendum)
Pain to entire posterior left leg x 1 month-denies injury-was seen by PCP 4 weeks ago-had [place] and rx naproxen with a ankle brace (pt is not wearing brace)-steady limping gait

## 2015-02-08 NOTE — Discharge Instructions (Signed)
Please call your doctor for a followup appointment within 24-48 hours. When you talk to your doctor please let them know that you were seen in the emergency department and have them acquire all of your records so that they can discuss the findings with you and formulate a treatment plan to fully care for your new and ongoing problems. Please follow-up with her primary care provider Please rest and stay hydrated Please avoid any physical strenuous activity Please rest, ice, elevate-toes above nose Please keep knee sleeve on for comfort purposes in all to aid in reduction of swelling Please continue to monitor symptoms closely and if symptoms are to worsen or change (fever greater than 101, chills, sweating, nausea, vomiting, chest pain, shortness of breathe, difficulty breathing, weakness, numbness, tingling, worsening or changes to pain pattern, fall, injury, loss of sensation, swelling to the leg, redness or changes to skin colored, hot to the touch, inability to control urine or bowel movements) please report back to the Emergency Department immediately.   Baker Cyst A Baker cyst is a sac-like structure that forms in the back of the knee. It is filled with the same fluid that is located in your knee. This fluid lubricates the bones and cartilage of the knee and allows them to move over each other more easily. CAUSES  When the knee becomes injured or inflamed, increased fluid forms in the knee. When this happens, the joint lining is pushed out behind the knee and forms the Baker cyst. This cyst may also be caused by inflammation from arthritic conditions and infections. SIGNS AND SYMPTOMS  A Baker cyst usually has no symptoms. When the cyst is substantially enlarged:  You may feel pressure behind the knee, stiffness in the knee, or a mass in the area behind the knee.  You may develop pain, redness, and swelling in the calf. This can suggest a blood clot and requires evaluation by your health care  provider. DIAGNOSIS  A Baker cyst is most often found during an ultrasound exam. This exam may have been performed for other reasons, and the cyst was found incidentally. Sometimes an MRI is used. This picks up other problems within a joint that an ultrasound exam may not. If the Baker cyst developed immediately after an injury, X-ray exams may be used to diagnose the cyst. TREATMENT  The treatment depends on the cause of the cyst. Anti-inflammatory medicines and rest often will be prescribed. If the cyst is caused by a bacterial infection, antibiotic medicines may be prescribed.  HOME CARE INSTRUCTIONS   If the cyst was caused by an injury, for the first 24 hours, keep the injured leg elevated on 2 pillows while lying down.  For the first 24 hours while you are awake, apply ice to the injured area:  Put ice in a plastic bag.  Place a towel between your skin and the bag.  Leave the ice on for 20 minutes, 2-3 times a day.  Only take over-the-counter or prescription medicines for pain, discomfort, or fever as directed by your health care provider.  Only take antibiotic medicine as directed. Make sure to finish it even if you start to feel better. MAKE SURE YOU:   Understand these instructions.  Will watch your condition.  Will get help right away if you are not doing well or get worse. Document Released: 08/13/2005 Document Revised: 06/03/2013 Document Reviewed: 03/25/2013 [facility] Patient Information 2015 [place], [place]. This information is not intended to replace advice given to you by your  health care provider. Make sure you discuss any questions you have with your health care provider.

## 2015-02-15 ENCOUNTER — Encounter: Payer: Self-pay | Admitting: Family Medicine

## 2015-02-15 ENCOUNTER — Ambulatory Visit (INDEPENDENT_AMBULATORY_CARE_PROVIDER_SITE_OTHER): Payer: BLUE CROSS/BLUE SHIELD | Admitting: Family Medicine

## 2015-02-15 VITALS — BP 136/85 | HR 70 | Ht 66.0 in | Wt 198.0 lb

## 2015-02-15 DIAGNOSIS — M25562 Pain in left knee: Secondary | ICD-10-CM

## 2015-02-15 MED ORDER — METHYLPREDNISOLONE ACETATE 40 MG/ML IJ SUSP
40.0000 mg | Freq: Once | INTRAMUSCULAR | Status: AC
  Administered 2015-02-15: 40 mg via INTRA_ARTICULAR

## 2015-02-15 NOTE — Patient Instructions (Signed)
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 These are the 4 classes of medicine you can use for this: Take tylenol 500mg  1-2 tabs three times a day for pain. Aleve 1-2 tabs twice a day with food Glucosamine sulfate 750mg  twice a day is a supplement that may help. Capsaicin, aspercreme, or biofreeze topically up to four times a day may also help with pain. Cortisone injections are an option - you were given one of these today.. If cortisone injections do not help, there are different types of shots that may help but they take longer to take effect. It's important that you continue to stay active. Straight leg raises, knee extensions 3 sets of 10 once a day (add ankle weight if these become too easy). Consider physical therapy to strengthen muscles around the joint that hurts to take pressure off of the joint itself. Shoe inserts with good arch support may be helpful. Heat or ice 15 minutes at a time 3-4 times a day as needed to help with pain. Water aerobics and cycling with low resistance are the best two types of exercise for arthritis. Follow up with me in 5-6 weeks for reevaluation.

## 2015-02-17 DIAGNOSIS — M25562 Pain in left knee: Secondary | ICD-10-CM | POA: Insufficient documentation

## 2015-02-17 NOTE — Assessment & Plan Note (Signed)
likely due to DJD which has caused her to get a bakers cyst in the knee.  Discussed tylenol, nsaids, glucosamine, topical medications.  Given intraarticular injection today as well.  Discussed home exercises.  Heat/ice.  F/u in 5-6 weeks.  After informed written consent, patient was lying supine on exam table. Left knee was prepped with alcohol swab and utilizing superolateral approach with ultrasound guidance, patient's left knee was injected intraarticularly with 3:1 marcaine: depomedrol. Patient tolerated the procedure well without immediate complications.

## 2015-02-17 NOTE — Progress Notes (Signed)
PCP: Phineas Inches, MD  Subjective:   HPI: Patient is a 59 y.o. female here for left knee pain.  Patient reports she started to get foot/ankle, calf and knee swelling in April, May. Had knee pain as well mainly posteriorly but also deep anteriorly. Pain level up to 6/10 at times. Had u/s negative for DVT though showed a 4cm bakers cyst. Given an IM injection which helped for one week. Did fall down steps in 2012 though pain didn't start then. No catching, locking, giving out.  Past Medical History  Diagnosis Date  . Hypertension   . Vertigo   . Diabetes mellitus without complication     Current Outpatient Prescriptions on File Prior to Visit  Medication Sig Dispense Refill  . LOSARTAN POTASSIUM PO Take by mouth.    Marland Kitchen NAPROXEN PO Take by mouth.     No current facility-administered medications on file prior to visit.    Past Surgical History  Procedure Laterality Date  . Cesarean section      No Known Allergies  History   Social History  . Marital Status: Married    Spouse Name: N/A  . Number of Children: N/A  . Years of Education: N/A   Occupational History  . Not on file.   Social History Main Topics  . Smoking status: Never Smoker   . Smokeless tobacco: Not on file  . Alcohol Use: No  . Drug Use: No  . Sexual Activity: Not on file   Other Topics Concern  . Not on file   Social History Narrative    No family history on file.  BP 136/85 mmHg  Pulse 70  Ht 5\' 6"  (1.676 m)  Wt 198 lb (89.812 kg)  BMI 31.97 kg/m2  Review of Systems: See HPI above.    Objective:  Physical Exam:  Gen: NAD  Left knee: Mild effusion.  No bruising, deformity. Mild fullness posterior knee. No TTP currently. FROM with pain beyond 90 degrees of flexion. Negative ant/post drawers. Negative valgus/varus testing. Negative lachmanns. Negative mcmurrays, apleys, patellar apprehension. NV intact distally.    Assessment & Plan:  1. Left knee pain - likely due to DJD  which has caused her to get a bakers cyst in the knee.  Discussed tylenol, nsaids, glucosamine, topical medications.  Given intraarticular injection today as well.  Discussed home exercises.  Heat/ice.  F/u in 5-6 weeks.  After informed written consent, patient was lying supine on exam table. Left knee was prepped with alcohol swab and utilizing superolateral approach with ultrasound guidance, patient's left knee was injected intraarticularly with 3:1 marcaine: depomedrol. Patient tolerated the procedure well without immediate complications.

## 2015-03-16 ENCOUNTER — Encounter: Payer: Self-pay | Admitting: Podiatry

## 2015-03-16 ENCOUNTER — Other Ambulatory Visit: Payer: Self-pay | Admitting: Podiatry

## 2015-03-16 ENCOUNTER — Ambulatory Visit (INDEPENDENT_AMBULATORY_CARE_PROVIDER_SITE_OTHER): Payer: BLUE CROSS/BLUE SHIELD

## 2015-03-16 ENCOUNTER — Ambulatory Visit (INDEPENDENT_AMBULATORY_CARE_PROVIDER_SITE_OTHER): Payer: BLUE CROSS/BLUE SHIELD | Admitting: Podiatry

## 2015-03-16 VITALS — BP 144/84 | HR 76 | Resp 12

## 2015-03-16 DIAGNOSIS — M25572 Pain in left ankle and joints of left foot: Secondary | ICD-10-CM

## 2015-03-16 DIAGNOSIS — E119 Type 2 diabetes mellitus without complications: Secondary | ICD-10-CM | POA: Diagnosis not present

## 2015-03-16 DIAGNOSIS — S93402A Sprain of unspecified ligament of left ankle, initial encounter: Secondary | ICD-10-CM

## 2015-03-16 NOTE — Patient Instructions (Signed)
Where your existing boot on your left leg/ankle as needed to control left ankle pain Okay to try late grade knee length compression hose on the left or right leg We will reschedule a consult with Dr. Earleen Newport to evaluate her left ankle pain in more detail Diabetes and Foot Care Diabetes may cause you to have problems because of poor blood supply (circulation) to your feet and legs. This may cause the skin on your feet to become thinner, break easier, and heal more slowly. Your skin may become dry, and the skin may peel and crack. You may also have nerve damage in your legs and feet causing decreased feeling in them. You may not notice minor injuries to your feet that could lead to infections or more serious problems. Taking care of your feet is one of the most important things you can do for yourself.  HOME CARE INSTRUCTIONS  Wear shoes at all times, even in the house. Do not go barefoot. Bare feet are easily injured.  Check your feet daily for blisters, cuts, and redness. If you cannot see the bottom of your feet, use a mirror or ask someone for help.  Wash your feet with warm water (do not use hot water) and mild soap. Then pat your feet and the areas between your toes until they are completely dry. Do not soak your feet as this can dry your skin.  Apply a moisturizing lotion or petroleum jelly (that does not contain alcohol and is unscented) to the skin on your feet and to dry, brittle toenails. Do not apply lotion between your toes.  Trim your toenails straight across. Do not dig under them or around the cuticle. File the edges of your nails with an emery board or nail file.  Do not cut corns or calluses or try to remove them with medicine.  Wear clean socks or stockings every day. Make sure they are not too tight. Do not wear knee-high stockings since they may decrease blood flow to your legs.  Wear shoes that fit properly and have enough cushioning. To break in new shoes, wear them for  just a few hours a day. This prevents you from injuring your feet. Always look in your shoes before you put them on to be sure there are no objects inside.  Do not cross your legs. This may decrease the blood flow to your feet.  If you find a minor scrape, cut, or break in the skin on your feet, keep it and the skin around it clean and dry. These areas may be cleansed with mild soap and water. Do not cleanse the area with peroxide, alcohol, or iodine.  When you remove an adhesive bandage, be sure not to damage the skin around it.  If you have a wound, look at it several times a day to make sure it is healing.  Do not use heating pads or hot water bottles. They may burn your skin. If you have lost feeling in your feet or legs, you may not know it is happening until it is too late.  Make sure your health care provider performs a complete foot exam at least annually or more often if you have foot problems. Report any cuts, sores, or bruises to your health care provider immediately. SEEK MEDICAL CARE IF:   You have an injury that is not healing.  You have cuts or breaks in the skin.  You have an ingrown nail.  You notice redness on your legs or  feet.  You feel burning or tingling in your legs or feet.  You have pain or cramps in your legs and feet.  Your legs or feet are numb.  Your feet always feel cold. SEEK IMMEDIATE MEDICAL CARE IF:   There is increasing redness, swelling, or pain in or around a wound.  There is a red line that goes up your leg.  Pus is coming from a wound.  You develop a fever or as directed by your health care provider.  You notice a bad smell coming from an ulcer or wound. Document Released: 08/10/2000 Document Revised: 04/15/2013 Document Reviewed: 01/20/2013 [facility] Patient Information 2015 [place], [place]. This information is not intended to replace advice given to you by your health care provider. Make sure you discuss any questions you have with  your health care provider.

## 2015-03-16 NOTE — Progress Notes (Signed)
   Subjective:    Patient ID: [name] [name], female    DOB: July 25, 1956, 59 y.o.   MRN: [id number]  HPI  This patient presents today complaining of approximately 2 months history of some swelling in her feet increasing in the p.m. reducing in the a.m. She's had no specific treatment for this problem and denies any pain in from the swelling She also describes a persistent numbness in her feet for the past 2 months that occurs on and off weightbearing. She denies any pain from the numbness or any other areas of numbness in her body She also describes recurrent left ankle pain. She describes an injury approximately 2 years ago that was treated with a Cam Walker boot and resolved in approximately a month. Approximately a year later she describes recurrent left ankle pain that was treated with saline injection and a boot and ultimately resolved. She now describes in the past 2 months pain nonweightbearing on the left ankle and rear foot area which requires surgery intermittently wear her ankle boot of two-week at a time to resolve the foot ankle pain. She denies any recent history of injury. He says she's been diagnosed with diabetes type 2 in March 2016  She denies any history of foot ulceration, claudication or any previous history of amputation  Review of Systems  Cardiovascular: Positive for leg swelling.       Objective:   Physical Exam  Orientated 3  Vascular: No ankle edema noted bilaterally Mild nonpitting edema noted in the dorsal left midfoot proximal to vamp area of the shoe DP and PT pulses 2/4 bilaterally  capillary reflex immediate bilaterally  Neurological: Sensation to 10 g monofilament wire intact 5/5 bilaterally Vibratory sensation reactive bilaterally Ankle reflex equal and reactive bilaterally  Dermatological: No skin lesions noted bilaterally Texture and turgor within normal limits bilaterally  Musculoskeletal: Hammertoe deformity second bilaterally There is  tenderness to palpation the medial anterior lateral posterior left ankle without any palpable lesions There is no crepitus,,, pain or limited range of motion of the ankles bilaterally There is no restriction in range of motion, crepitus, pain of the subtalar, midtarsal, or MPJ joints bilaterally X-ray examination left foot weightbearing  Intact bony structures without fracture and/or dislocation Bone appears adequate Joint spaces appear adequate Hammertoe second Posterior and inferior calcaneal spurs  Radiographic impression: No acute bony abnormality noted left foot  X-ray examination weightbearing left ankle  Intact bony structure without fracture and/or dislocation Ankle mortise is unremarkable No deformities noted Bone density adequate  Radiographic impression: No acute bony abnormality noted in the left ankle     Assessment & Plan:   Assessment: Satisfactory neurovascular status vascular status Tingling may be suggestive of beginning signs of diabetic peripheral neuropathy Chronic left ankle pain that needs further evaluation Mild edema left foot that clinically appears not significant Diabetic without any obvious complications  Plan: I reviewed the results of the examination x-ray with patient today. I made aware that thought her general neurovascular status was adequate at this time and did not need any obvious follow-up. In regards to her ongoing left ankle pain I recommended that she continue to wear her Cam Walker boot as needed.  I rescheduled for a follow-up consultation for Dr. Earleen Newport to further evaluate her ongoing left ankle pain

## 2015-03-17 ENCOUNTER — Encounter: Payer: Self-pay | Admitting: Podiatry

## 2015-04-15 ENCOUNTER — Ambulatory Visit: Payer: BLUE CROSS/BLUE SHIELD | Admitting: Podiatry

## 2018-07-15 ENCOUNTER — Other Ambulatory Visit: Payer: Self-pay

## 2018-07-15 ENCOUNTER — Emergency Department (HOSPITAL_BASED_OUTPATIENT_CLINIC_OR_DEPARTMENT_OTHER): Payer: BLUE CROSS/BLUE SHIELD

## 2018-07-15 ENCOUNTER — Encounter (HOSPITAL_BASED_OUTPATIENT_CLINIC_OR_DEPARTMENT_OTHER): Payer: Self-pay | Admitting: *Deleted

## 2018-07-15 ENCOUNTER — Emergency Department (HOSPITAL_BASED_OUTPATIENT_CLINIC_OR_DEPARTMENT_OTHER)
Admission: EM | Admit: 2018-07-15 | Discharge: 2018-07-16 | Disposition: A | Discharge status: Home / Self Care | Payer: BLUE CROSS/BLUE SHIELD | Attending: Emergency Medicine | Admitting: Emergency Medicine

## 2018-07-15 DIAGNOSIS — R600 Localized edema: Secondary | ICD-10-CM | POA: Insufficient documentation

## 2018-07-15 DIAGNOSIS — H9313 Tinnitus, bilateral: Secondary | ICD-10-CM | POA: Diagnosis not present

## 2018-07-15 DIAGNOSIS — M25512 Pain in left shoulder: Secondary | ICD-10-CM | POA: Insufficient documentation

## 2018-07-15 DIAGNOSIS — M549 Dorsalgia, unspecified: Secondary | ICD-10-CM | POA: Diagnosis not present

## 2018-07-15 DIAGNOSIS — E119 Type 2 diabetes mellitus without complications: Secondary | ICD-10-CM | POA: Diagnosis not present

## 2018-07-15 DIAGNOSIS — M7918 Myalgia, other site: Secondary | ICD-10-CM

## 2018-07-15 DIAGNOSIS — I10 Essential (primary) hypertension: Secondary | ICD-10-CM | POA: Diagnosis not present

## 2018-07-15 DIAGNOSIS — R002 Palpitations: Secondary | ICD-10-CM | POA: Diagnosis not present

## 2018-07-15 DIAGNOSIS — Z7982 Long term (current) use of aspirin: Secondary | ICD-10-CM | POA: Diagnosis not present

## 2018-07-15 DIAGNOSIS — M25571 Pain in right ankle and joints of right foot: Secondary | ICD-10-CM | POA: Insufficient documentation

## 2018-07-15 DIAGNOSIS — R51 Headache: Secondary | ICD-10-CM | POA: Insufficient documentation

## 2018-07-15 DIAGNOSIS — Z7984 Long term (current) use of oral hypoglycemic drugs: Secondary | ICD-10-CM | POA: Insufficient documentation

## 2018-07-15 LAB — BASIC METABOLIC PANEL
ANION GAP: 10 (ref 5–15)
BUN: 13 mg/dL (ref 8–23)
CALCIUM: 9.3 mg/dL (ref 8.9–10.3)
CO2: 26 mmol/L (ref 22–32)
Chloride: 101 mmol/L (ref 98–111)
Creatinine, Ser: 0.89 mg/dL (ref 0.44–1.00)
GFR calc non Af Amer: >60 mL/min (ref >60)
Glucose, Bld: 110 mg/dL — ABNORMAL HIGH (ref 70–99)
Potassium: 3.4 mmol/L — ABNORMAL LOW (ref 3.5–5.1)
Sodium: 137 mmol/L (ref 135–145)

## 2018-07-15 LAB — TROPONIN I: Troponin I: <0.03 ng/mL (ref <0.03)

## 2018-07-15 LAB — CBC
HCT: 43.7 % (ref 36.0–46.0)
Hemoglobin: 13.4 g/dL (ref 12.0–15.0)
MCH: 24.1 pg — ABNORMAL LOW (ref 26.0–34.0)
MCHC: 30.7 g/dL (ref 30.0–36.0)
MCV: 78.6 fL — AB (ref 80.0–100.0)
NRBC: 0 % (ref 0.0–0.2)
PLATELETS: 470 10*3/uL — AB (ref 150–400)
RBC: 5.56 MIL/uL — AB (ref 3.87–5.11)
RDW: 14.6 % (ref 11.5–15.5)
WBC: 8.4 10*3/uL (ref 4.0–10.5)

## 2018-07-15 MED ORDER — ACETAMINOPHEN 325 MG PO TABS
650.0000 mg | ORAL_TABLET | Freq: Once | ORAL | Status: AC
  Administered 2018-07-15: 650 mg via ORAL
  Filled 2018-07-15: qty 2

## 2018-07-15 MED ORDER — SODIUM CHLORIDE 0.9 % IV BOLUS
250.0000 mL | Freq: Once | INTRAVENOUS | Status: AC
  Administered 2018-07-15: 21:00:00 via INTRAVENOUS

## 2018-07-15 MED ORDER — LIDOCAINE 5 % EX PTCH: 1.0000 | MEDICATED_PATCH | CUTANEOUS | 0 refills | Status: AC

## 2018-07-15 NOTE — ED Notes (Signed)
ED Provider at bedside. 
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
## 2018-07-15 NOTE — ED Notes (Signed)
Patient transported to X-ray 

## 2018-07-16 DIAGNOSIS — M25512 Pain in left shoulder: Secondary | ICD-10-CM | POA: Diagnosis not present
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
## 2020-08-18 ENCOUNTER — Ambulatory Visit: Payer: Self-pay | Admitting: *Deleted

## 2020-08-18 NOTE — Telephone Encounter (Signed)
Patient calling with 101 temperature last night and 99 temperature this morning. Post hysterectomy 7 days ago. Has mild lower abdominal cramping. Denies any bleeding/drainage. Patient stated she was feeling good and got really busy and maybe overdone it. No difficulty voiding, LBM yesterday. Advised patient to call pcp/specialist for further advice. Care advice including increase water intake and slow down/increase activity slowly. With any SOB, Bleeding/increased abdominal pain/dizziness  seek evaluation immediately.   Reason for Disposition . [1] Fever > 100.0 F (37.8 C) AND [2] surgery in the last month  Answer Assessment - Initial Assessment Questions 1. TEMPERATURE: "What is the most recent temperature?"  "How was it measured?"     99.6 oral this morning 2. ONSET: "When did the fever start?"      Last night. 3. SYMPTOMS: "Do you have any other symptoms besides the fever?"  (e.g., colds, headache, sore throat, earache, cough, rash, diarrhea, vomiting, abdominal pain)     Mild lower abdominal cramping 4. CAUSE: If there are no symptoms, ask: "What do you think is causing the fever?"      She is post-op hysterectomy one week. 5. CONTACTS: "Does anyone else in the family have an infection?"     no 6. TREATMENT: "What have you done so far to treat this fever?" (e.g., medications)     ibuprofen 7. IMMUNOCOMPROMISE: "Do you have of the following: diabetes, HIV positive, splenectomy, cancer chemotherapy, chronic steroid treatment, transplant patient, etc."     no 8. PREGNANCY: "Is there any chance you are pregnant?" "When was your last menstrual period?"     na 9. TRAVEL: "Have you traveled out of the country in the last month?" (e.g., travel history, exposures)     Not asked.  Protocols used: FEVER-A-AH

## 2021-07-19 ENCOUNTER — Encounter (HOSPITAL_BASED_OUTPATIENT_CLINIC_OR_DEPARTMENT_OTHER): Payer: Self-pay

## 2021-07-19 ENCOUNTER — Other Ambulatory Visit: Payer: Self-pay

## 2021-07-19 ENCOUNTER — Emergency Department (HOSPITAL_BASED_OUTPATIENT_CLINIC_OR_DEPARTMENT_OTHER)
Admission: EM | Admit: 2021-07-19 | Discharge: 2021-07-19 | Disposition: A | Discharge status: Home / Self Care | Payer: BC Managed Care – PPO | Attending: Emergency Medicine | Admitting: Emergency Medicine

## 2021-07-19 DIAGNOSIS — I10 Essential (primary) hypertension: Secondary | ICD-10-CM | POA: Diagnosis not present

## 2021-07-19 DIAGNOSIS — Z79899 Other long term (current) drug therapy: Secondary | ICD-10-CM | POA: Insufficient documentation

## 2021-07-19 DIAGNOSIS — Z7982 Long term (current) use of aspirin: Secondary | ICD-10-CM | POA: Diagnosis not present

## 2021-07-19 DIAGNOSIS — M545 Low back pain, unspecified: Secondary | ICD-10-CM | POA: Insufficient documentation

## 2021-07-19 DIAGNOSIS — E119 Type 2 diabetes mellitus without complications: Secondary | ICD-10-CM | POA: Insufficient documentation

## 2021-07-19 LAB — URINALYSIS, ROUTINE W REFLEX MICROSCOPIC
Bilirubin Urine: NEGATIVE
Glucose, UA: NEGATIVE mg/dL
Hgb urine dipstick: NEGATIVE
Ketones, ur: NEGATIVE mg/dL
Leukocytes,Ua: NEGATIVE
Nitrite: NEGATIVE
Protein, ur: NEGATIVE mg/dL
Specific Gravity, Urine: 1.02 (ref 1.005–1.030)
pH: 6.5 (ref 5.0–8.0)

## 2021-07-19 MED ORDER — CYCLOBENZAPRINE HCL 10 MG PO TABS: 10.0000 mg | ORAL_TABLET | Freq: Two times a day (BID) | ORAL | 0 refills | Status: AC | PRN

## 2021-07-19 MED ORDER — DIAZEPAM 2 MG PO TABS
2.0000 mg | ORAL_TABLET | Freq: Once | ORAL | Status: AC
  Administered 2021-07-19: 2 mg via ORAL
  Filled 2021-07-19: qty 1

## 2021-07-19 MED ORDER — ONDANSETRON 4 MG PO TBDP
4.0000 mg | ORAL_TABLET | Freq: Once | ORAL | Status: AC
  Administered 2021-07-19: 4 mg via ORAL
  Filled 2021-07-19: qty 1

## 2021-07-19 MED ORDER — NAPROXEN 250 MG PO TABS
500.0000 mg | ORAL_TABLET | Freq: Once | ORAL | Status: AC
  Administered 2021-07-19: 500 mg via ORAL
  Filled 2021-07-19: qty 2

## 2021-07-19 NOTE — ED Notes (Signed)
Johana, PA at bedside assessing pt at this time.

## 2021-07-19 NOTE — ED Notes (Signed)
AVS with prescriptions provided to and discussed with patient and family member at bedside. Pt verbalizes understanding of discharge instructions and denies any questions or concerns at this time. Pt has ride home. Pt ambulated out of department independently with steady gait.  

## 2021-07-19 NOTE — ED Triage Notes (Signed)
First contact with patient. Patient arrived via triage with daughter with complaints of left lower back pain - Patient believes this is from assisting her husband (lifting). Pt is A&OX 4. Respirations even/unlabored. Patient placed on monitor and call light within reach. Patient updated on plan of care.

## 2021-07-19 NOTE — ED Provider Notes (Signed)
[place] [facility] DEPART[facility] Provider Note   CSN: [id number] Arrival date & time: 07/19/21  1013     History Chief Complaint  Patient presents with   Back Pain    [name] [name] is a 65 y.o. female.  65 y.o female with a PMH DM, HTN, Vertigo presents to the ED with a chief complaint of left-sided back pain that is been ongoing since Sunday.  She reports her husband was standing up from the sofa, when he suddenly kind of fell on her, causing her to extend her right leg, she feels like most of his weight was on her.  Reports lying down in bed that night and began to feel significant pain to the left side of her back.  She reports the pain is exacerbated with any kind of movement, ambulation.  Does report when she leans forward and has her back sort of stretch it does alleviate some of the pain.  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  Has been taking arthritis pain medication without much improvement.  In addition she is endorsing nausea.  No fever, no vomiting, no urinary symptoms, no similar episodes in the past.     The history is provided by the patient and medical records.  Back Pain Location:  Lumbar spine Quality:  Cramping Radiates to:  Does not radiate Associated symptoms: no abdominal pain, no chest pain and no fever       Past Medical History:  Diagnosis Date   Diabetes mellitus without complication ([employer])    Hypertension    Vertigo     Patient Active Problem List   Diagnosis Date Noted   Left knee pain 02/17/2015   BACK PAIN, THORACIC REGION 02/10/2010   UNSPECIFIED ANEMIA 11/10/2009   CARDIAC MURMUR 10/27/2009   FIBROIDS, UTERUS 01/16/2009   POSTMENOPAUSAL BLEEDING 01/11/2009   ALLERGIC RHINITIS CAUSE UNSPECIFIED 04/22/2008   PERIMENOPAUSAL SYNDROME 06/26/2007   HYPERLIPIDEMIA 03/27/2007   HYPERTENSION 09/18/2006    Past Surgical History:  Procedure Laterality Date   CESAREAN SECTION     HYSTERECTOMY ABDOMINAL WITH SALPINGO-OOPHORECTOMY        OB History   No obstetric history on file.     History reviewed. No pertinent family history.  Social History   Tobacco Use   Smoking status: Never   Smokeless tobacco: Never  Substance Use Topics   Alcohol use: No    Alcohol/week: 0.0 standard drinks   Drug use: No    Home Medications Prior to Admission medications   Medication Sig Start Date End Date Taking? Authorizing Provider  cyclobenzaprine (FLEXERIL) 10 MG tablet Take 1 tablet (10 mg total) by mouth 2 (two) times daily as needed for up to 7 days for muscle spasms. 07/19/21 07/26/21 Yes Nevaan Bunton, Beverley Fiedler, PA-C  aspirin 81 MG chewable tablet Chew 81 mg by mouth.    [provider]  CARVEDILOL PO Take by mouth.    [provider]  lidocaine (LIDODERM) 5 % Place 1 patch onto the skin daily. Remove & Discard patch within 12 hours or as directed by MD  Use over left scapula/trapezius muscles 07/15/18   Nuala Alpha A, PA-C  LOSARTAN POTASSIUM PO Take by mouth.    [provider]  metFORMIN (GLUCOPHAGE) 500 MG tablet Take 500 mg by mouth. 10/11/14   [provider]  NAPROXEN PO Take by mouth.    [provider]  ONE TOUCH ULTRA TEST test strip CHECK BLOOD SUGAR EVERY MORNING BEFORE BREAKFAST. 11/25/14  [provider]  [facility] DELICA LANCETS 08Q MISC 1 EACH BY MISCELLANEOUS ROUTE DAILY. 11/25/14   [provider]    Allergies    Patient has no known allergies.  Review of Systems   Review of Systems  Constitutional:  Negative for fever.  Respiratory:  Negative for shortness of breath.   Cardiovascular:  Negative for chest pain.  Gastrointestinal:  Positive for nausea. Negative for abdominal pain, diarrhea and vomiting.  Genitourinary:  Negative for difficulty urinating.  Musculoskeletal:  Positive for back pain. Negative for joint swelling and myalgias.  Skin:  Negative for pallor and wound.   Physical Exam Updated Vital Signs BP 140/72   Pulse 66    Temp 98.1 F (36.7 C) (Oral)   Resp 14   Ht 5\' 3"  (1.6 m)   Wt 95.3 kg   SpO2 100%   BMI 37.20 kg/m   Physical Exam Vitals and nursing note reviewed.  Constitutional:      Appearance: Normal appearance. She is not ill-appearing.  HENT:     Head: Normocephalic and atraumatic.  Eyes:     Pupils: Pupils are equal, round, and reactive to light.  Cardiovascular:     Rate and Rhythm: Normal rate.  Pulmonary:     Effort: Pulmonary effort is normal.  Abdominal:     General: Abdomen is flat.  Musculoskeletal:     Cervical back: Normal range of motion and neck supple.     Lumbar back: Tenderness present. No spasms.       Back:  Skin:    General: Skin is warm and dry.  Neurological:     Mental Status: She is alert and oriented to person, place, and time.    ED Results / Procedures / Treatments   Labs (all labs ordered are listed, but only abnormal results are displayed) Labs Reviewed  URINALYSIS, ROUTINE W REFLEX MICROSCOPIC    EKG None  Radiology No results found.  Procedures Procedures   Medications Ordered in ED Medications  naproxen (NAPROSYN) tablet 500 mg (500 mg Oral Given 07/19/21 1052)  ondansetron (ZOFRAN-ODT) disintegrating tablet 4 mg (4 mg Oral Given 07/19/21 1052)  diazepam (VALIUM) tablet 2 mg (2 mg Oral Given 07/19/21 1052)    ED Course  I have reviewed the triage vital signs and the nursing notes.  Pertinent labs & imaging results that were available during my care of the patient were reviewed by me and considered in my medical decision making (see chart for details).    MDM Rules/Calculators/A&P    Patient presents to the ED with a chief complaint of left lower back pain that began 3 days ago after trying to support her husband's weight on her.  Reports the symptoms have worsened with movement, somewhat alleviated by stretching.  Has been taking arthritis pain without much improvement in symptoms.  Also endorsing some nausea but reports she  has been taking medication on an empty stomach.  On arrival vitals are within normal limits, exam significant for tenderness to palpation along the left lower lumbar spine, no CVA tenderness.  There is pain with lifting of the left and right leg, however ambulatory with antalgic gait.  No visible rashes to suggest any shingles or hematoma to suggest any trauma.  She is denying any urinary symptoms.  UA was checked without any nitrites, or blood present.  She did receive Valium, Aleve while in the ED with improvement in symptoms.  We discussed likely muscle strain, will go home on a  short course of muscle relaxers along with Aleve, IcyHot patches for comfort.  Strict return precautions were discussed with her and daughter at the bedside who are agreeable of plan and treatment.  Patient is stable for discharge.  Portions of this note were generated with [employer]. Dictation errors may occur despite best attempts at proofreading.  Final Clinical Impression(s) / ED Diagnoses Final diagnoses:  Acute left-sided low back pain without sciatica    Rx / DC Orders ED Discharge Orders          Ordered    cyclobenzaprine (FLEXERIL) 10 MG tablet  2 times daily PRN        07/19/21 1141             Janeece Fitting, PA-C 07/19/21 1146    Fredia Sorrow, MD 07/19/21 1514

## 2021-07-19 NOTE — Discharge Instructions (Addendum)
I have prescribed muscle relaxers for your pain, please do not drink or drive while taking this medications as it can make you drowsy.  In addition you may take some over-the-counter Aleve, you can also benefit from IcyHot, Salonpas patches to your back.   .   Please follow-up with PCP in 1 week for reevaluation of your symptoms.If you experience any bowel or bladder incontinence, fever, worsening in your symptoms please return to the ED.

## 2022-06-17 ENCOUNTER — Ambulatory Visit
Admission: EM | Admit: 2022-06-17 | Discharge: 2022-06-17 | Disposition: A | Discharge status: Home / Self Care | Payer: BC Managed Care – PPO

## 2022-06-17 DIAGNOSIS — H109 Unspecified conjunctivitis: Secondary | ICD-10-CM | POA: Diagnosis not present

## 2022-06-17 MED ORDER — MOXIFLOXACIN HCL 0.5 % OP SOLN: 1.0000 [drp] | Freq: Three times a day (TID) | OPHTHALMIC | 0 refills | Status: AC

## 2022-06-17 NOTE — Discharge Instructions (Addendum)
Advised patient to instill eyedrops as directed.  Advised if right eye develops similar symptoms may treat right eye with Vigamox as well.  Advised if symptoms worsen and/or unresolved please follow-up with your optometrist/ophthalmologist for further evaluation.

## 2022-06-17 NOTE — ED Provider Notes (Signed)
[facility]    CSN: [id number] Arrival date & time: 06/17/22  1301      History   Chief Complaint Chief Complaint  Patient presents with   Eye Problem    HPI [name] [name] is a 66 y.o. female.   HPI [name] presents with left eye pain, swelling, and redness for 1 day.  PMH significant for morbid obesity, I3KV without complication, HTN, and vertigo.  Past Medical History:  Diagnosis Date   Diabetes mellitus without complication ([place])    Hypertension    Vertigo     Patient Active Problem List   Diagnosis Date Noted   Left knee pain 02/17/2015   BACK PAIN, THORACIC REGION 02/10/2010   UNSPECIFIED ANEMIA 11/10/2009   CARDIAC MURMUR 10/27/2009   FIBROIDS, UTERUS 01/16/2009   POSTMENOPAUSAL BLEEDING 01/11/2009   ALLERGIC RHINITIS CAUSE UNSPECIFIED 04/22/2008   PERIMENOPAUSAL SYNDROME 06/26/2007   HYPERLIPIDEMIA 03/27/2007   HYPERTENSION 09/18/2006    Past Surgical History:  Procedure Laterality Date   CESAREAN SECTION     HYSTERECTOMY ABDOMINAL WITH SALPINGO-OOPHORECTOMY      OB History   No obstetric history on file.      Home Medications    Prior to Admission medications   Medication Sig Start Date End Date Taking? Authorizing Provider  aspirin 81 MG chewable tablet Chew 81 mg by mouth.   Yes [provider]  moxifloxacin (VIGAMOX) 0.5 % ophthalmic solution Place 1 drop into the left eye 3 (three) times daily for 7 days. 06/17/22 06/24/22 Yes Eliezer Lofts, FNP  atorvastatin (LIPITOR) 20 MG tablet Take 20 mg by mouth daily. 05/07/22   [provider]  CARVEDILOL PO Take by mouth.    [provider]  lidocaine (LIDODERM) 5 % Place 1 patch onto the skin daily. Remove & Discard patch within 12 hours or as directed by MD  Use over left scapula/trapezius muscles 07/15/18   Nuala Alpha A, PA-C  LOSARTAN POTASSIUM PO Take by mouth.    [provider]  metFORMIN (GLUCOPHAGE) 500 MG tablet  Take 500 mg by mouth. 10/11/14   [provider]  NAPROXEN PO Take by mouth.    [provider]  ONE TOUCH ULTRA TEST test strip CHECK BLOOD SUGAR EVERY MORNING BEFORE BREAKFAST. 11/25/14   [provider]  ONETOUCH DELICA LANCETS 42V MISC 1 EACH BY MISCELLANEOUS ROUTE DAILY. 11/25/14   [provider]    Family History Family History  Problem Relation Age of Onset   Diabetes Mother    Cancer Father     Social History Social History   Tobacco Use   Smoking status: Never   Smokeless tobacco: Never  Vaping Use   Vaping Use: Never used  Substance Use Topics   Alcohol use: No    Alcohol/week: 0.0 standard drinks of alcohol   Drug use: No     Allergies   Patient has no known allergies.   Review of Systems Review of Systems  Eyes:  Positive for redness.  All other systems reviewed and are negative.    Physical Exam Triage Vital Signs ED Triage Vitals [06/17/22 1322]  Enc Vitals Group     BP      Pulse      Resp      Temp      Temp src      SpO2      Weight 221 lb (100.2 kg)     Height 5' 3.5" (1.613 m)  Head Circumference      Peak Flow      Pain Score 7     Pain Loc      Pain Edu?      Excl. in [place]?    No data found.  Updated Vital Signs BP 137/77 (BP Location: Left Arm)   Pulse 78   Temp 99.3 F (37.4 C) (Oral)   Resp 20   Ht 5' 3.5" (1.613 m)   Wt 221 lb (100.2 kg)   SpO2 99%   BMI 38.53 kg/m      Physical Exam Vitals and nursing note reviewed.  Constitutional:      Appearance: Normal appearance. She is normal weight.  HENT:     Head: Normocephalic and atraumatic.     Mouth/Throat:     Mouth: Mucous membranes are moist.     Pharynx: Oropharynx is clear.  Eyes:     Extraocular Movements: Extraocular movements intact.     Conjunctiva/sclera: Conjunctivae normal.     Pupils: Pupils are equal, round, and reactive to light.     Comments: Left eye: Sclera with +2 injection  Cardiovascular:     Rate and  Rhythm: Normal rate and regular rhythm.     Pulses: Normal pulses.     Heart sounds: Normal heart sounds.  Pulmonary:     Effort: Pulmonary effort is normal.     Breath sounds: Normal breath sounds. No wheezing, rhonchi or rales.  Musculoskeletal:        General: Normal range of motion.     Cervical back: Normal range of motion and neck supple.  Skin:    General: Skin is warm and dry.  Neurological:     General: No focal deficit present.     Mental Status: She is alert and oriented to person, place, and time.      UC Treatments / Results  Labs (all labs ordered are listed, but only abnormal results are displayed) Labs Reviewed - No data to display  EKG   Radiology No results found.  Procedures Procedures (including critical care time)  Medications Ordered in UC Medications - No data to display  Initial Impression / Assessment and Plan / UC Course  I have reviewed the triage vital signs and the nursing notes.  Pertinent labs & imaging results that were available during my care of the patient were reviewed by me and considered in my medical decision making (see chart for details).     MDM: 1.  Conjunctivitis of left eye, unspecified conjunctivitis type-Rx'd Vigamox. Advised patient to instill eyedrops as directed.  Advised if right eye develops similar symptoms may treat right eye with Vigamox as well.  Advised if symptoms worsen and/or unresolved please follow-up with your optometrist/ophthalmologist for further evaluation.  Patient discharged home, hemodynamically stable. Final Clinical Impressions(s) / UC Diagnoses   Final diagnoses:  Conjunctivitis of left eye, unspecified conjunctivitis type     Discharge Instructions      Advised patient to instill eyedrops as directed.  Advised if right eye develops similar symptoms may treat right eye with Vigamox as well.  Advised if symptoms worsen and/or unresolved please follow-up with your optometrist/ophthalmologist  for further evaluation.     ED Prescriptions     Medication Sig Dispense Auth. Provider   moxifloxacin (VIGAMOX) 0.5 % ophthalmic solution Place 1 drop into the left eye 3 (three) times daily for 7 days. 3 mL Eliezer Lofts, FNP      PDMP not reviewed this encounter.  Eliezer Lofts, [place] 06/17/22 1348

## 2022-06-17 NOTE — ED Triage Notes (Signed)
Pt presents to Urgent Care with c/o L eye pain, swelling, and redness since yesterday evening.

## 2022-06-18 ENCOUNTER — Telehealth: Payer: Self-pay | Admitting: Emergency Medicine

## 2022-06-18 NOTE — Telephone Encounter (Signed)
[place].  Advised if doing well she can disregard this call.  Any questions or concerns feel free to give the office a call back.

## 2024-05-04 ENCOUNTER — Ambulatory Visit
Admission: EM | Admit: 2024-05-04 | Discharge: 2024-05-04 | Disposition: A | Discharge status: Home / Self Care | Payer: Medicare (Managed Care) | Attending: Family Medicine | Admitting: Family Medicine

## 2024-05-04 ENCOUNTER — Encounter: Payer: Self-pay | Admitting: Emergency Medicine

## 2024-05-04 DIAGNOSIS — R42 Dizziness and giddiness: Secondary | ICD-10-CM

## 2024-05-04 MED ORDER — MECLIZINE HCL 25 MG PO TABS: 25.0000 mg | ORAL_TABLET | Freq: Three times a day (TID) | ORAL | 0 refills | Status: AC | PRN

## 2024-05-04 MED ORDER — ONDANSETRON HCL 8 MG PO TABS: 8.0000 mg | ORAL_TABLET | Freq: Three times a day (TID) | ORAL | 0 refills | Status: AC | PRN

## 2024-05-04 NOTE — Discharge Instructions (Addendum)
 Make sure that you are drinking lots of water Take meclizine  for the dizziness.  May take up to 3 times a day I have prescribed Zofran  to take if needed for nausea I am including instructions on the Epley maneuver which will help to reduce dizziness See your doctor if not better towards the end of the week

## 2024-05-04 NOTE — ED Provider Notes (Signed)
 [facility]    CSN: [id number] Arrival date & time: 05/04/24  1547      History   Chief Complaint Chief Complaint  Patient presents with   Dizziness    HPI [name] [name] is a 68 y.o. female.   Patient states that she suffered with vertigo in the past.  She has had a couple spells of vertigo where she was disabled for a week at a time, unable to get out of bed without vomiting and spinning.  She states that today she went into her ladies room at lunchtime and felt a sudden spinning.  She states that she felt like she would throw up and that her balance was off.  She had a coworker drive her home.  Her daughter has brought her here.  She has had no recent cough or cold or sinus symptoms.  She has had no head injury.  She has no numbness or weakness in arms and legs.  Denies any heart disease or palpitations    Past Medical History:  Diagnosis Date   Diabetes mellitus without complication ([facility])    Hypertension    Vertigo     Patient Active Problem List   Diagnosis Date Noted   Left knee pain 02/17/2015   BACK PAIN, THORACIC REGION 02/10/2010   UNSPECIFIED ANEMIA 11/10/2009   CARDIAC MURMUR 10/27/2009   FIBROIDS, UTERUS 01/16/2009   POSTMENOPAUSAL BLEEDING 01/11/2009   Allergic rhinitis 04/22/2008   Menopausal and postmenopausal disorder 06/26/2007   HYPERLIPIDEMIA 03/27/2007   Essential hypertension 09/18/2006    Past Surgical History:  Procedure Laterality Date   CESAREAN SECTION     HYSTERECTOMY ABDOMINAL WITH SALPINGO-OOPHORECTOMY      OB History   No obstetric history on file.      Home Medications    Prior to Admission medications   Medication Sig Start Date End Date Taking? Authorizing Provider  atorvastatin (LIPITOR) 20 MG tablet Take 20 mg by mouth daily. 05/07/22  Yes [provider]  cetirizine (ZYRTEC) 5 MG tablet Take 5 mg by mouth daily. 10/07/17  Yes [provider]  lidocaine  (LIDODERM ) 5 % Place 1 patch onto the skin  daily. Remove & Discard patch within 12 hours or as directed by MD  Use over left scapula/trapezius muscles 07/15/18  Yes Donah Riis A, PA-C  losartan-hydrochlorothiazide (HYZAAR) 100-25 MG tablet Take 1 tablet by mouth daily. 06/04/16  Yes [provider]  meclizine  (ANTIVERT ) 25 MG tablet Take 1 tablet (25 mg total) by mouth 3 (three) times daily as needed for dizziness. 05/04/24  Yes Maranda Jamee Jacob, MD  meloxicam (MOBIC) 15 MG tablet Take 15 mg by mouth daily. 04/17/24  Yes [provider]  metFORMIN (GLUCOPHAGE) 500 MG tablet Take 500 mg by mouth. 10/11/14  Yes [provider]  ondansetron  (ZOFRAN ) 8 MG tablet Take 1 tablet (8 mg total) by mouth every 8 (eight) hours as needed for nausea or vomiting. 05/04/24  Yes Maranda Jamee Jacob, MD  ONE TOUCH ULTRA TEST test strip CHECK BLOOD SUGAR EVERY MORNING BEFORE BREAKFAST. 11/25/14  Yes [provider]  ONETOUCH DELICA LANCETS 33G MISC 1 EACH BY MISCELLANEOUS ROUTE DAILY. 11/25/14  Yes [provider]    Family History Family History  Problem Relation Age of Onset   Diabetes Mother    Cancer Father     Social History Social History   Tobacco Use   Smoking status: Never   Smokeless tobacco: Never  Vaping Use   Vaping  status: Never Used  Substance Use Topics   Alcohol use: No    Alcohol/week: 0.0 standard drinks of alcohol   Drug use: No     Allergies   Patient has no known allergies.   Review of Systems Review of Systems See HPI  Physical Exam Triage Vital Signs ED Triage Vitals  Encounter Vitals Group     BP 05/04/24 1559 (!) 146/82     Girls Systolic BP Percentile --      Girls Diastolic BP Percentile --      Boys Systolic BP Percentile --      Boys Diastolic BP Percentile --      Pulse Rate 05/04/24 1559 65     Resp 05/04/24 1559 18     Temp 05/04/24 1559 98.7 F (37.1 C)     Temp Source 05/04/24 1559 Oral     SpO2 05/04/24 1559 100 %     Weight 05/04/24 1601 213  lb (96.6 kg)     Height 05/04/24 1601 5' 3 (1.6 m)     Head Circumference --      Peak Flow --      Pain Score 05/04/24 1601 0     Pain Loc --      Pain Education --      Exclude from Growth Chart --    No data found.  Updated Vital Signs BP (!) 146/82 (BP Location: Right Arm)   Pulse 65   Temp 98.7 F (37.1 C) (Oral)   Resp 18   Ht 5' 3 (1.6 m)   Wt 96.6 kg   SpO2 100%   BMI 37.73 kg/m   Physical Exam Constitutional:      General: She is not in acute distress.    Appearance: Normal appearance. She is well-developed.  HENT:     Head: Normocephalic and atraumatic.     Right Ear: Tympanic membrane normal.     Left Ear: Tympanic membrane normal.     Nose: Nose normal. No rhinorrhea.     Mouth/Throat:     Mouth: Mucous membranes are moist.     Pharynx: No posterior oropharyngeal erythema.  Eyes:     Conjunctiva/sclera: Conjunctivae normal.     Pupils: Pupils are equal, round, and reactive to light.     Comments: 1 beat nystagmus towards left gaze  Cardiovascular:     Rate and Rhythm: Normal rate and regular rhythm.  Pulmonary:     Effort: Pulmonary effort is normal. No respiratory distress.  Musculoskeletal:        General: Normal range of motion.     Cervical back: Normal range of motion.  Skin:    General: Skin is warm and dry.  Neurological:     General: No focal deficit present.     Mental Status: She is alert.      UC Treatments / Results  Labs (all labs ordered are listed, but only abnormal results are displayed) Labs Reviewed - No data to display  EKG   Radiology No results found.  Procedures Procedures (including critical care time)  Medications Ordered in UC Medications - No data to display  Initial Impression / Assessment and Plan / UC Course  I have reviewed the triage vital signs and the nursing notes.  Pertinent labs & imaging results that were available during my care of the patient were reviewed by me and considered in my medical  decision making (see chart for details).     Final Clinical Impressions(s) /  UC Diagnoses   Final diagnoses:  Vertigo     Discharge Instructions      Make sure that you are drinking lots of water Take meclizine  for the dizziness.  May take up to 3 times a day I have prescribed Zofran  to take if needed for nausea I am including instructions on the Epley maneuver which will help to reduce dizziness See your doctor if not better towards the end of the week   ED Prescriptions     Medication Sig Dispense Auth. Provider   meclizine  (ANTIVERT ) 25 MG tablet Take 1 tablet (25 mg total) by mouth 3 (three) times daily as needed for dizziness. 30 tablet Maranda Jamee Jacob, MD   ondansetron  (ZOFRAN ) 8 MG tablet Take 1 tablet (8 mg total) by mouth every 8 (eight) hours as needed for nausea or vomiting. 20 tablet Maranda Jamee Jacob, MD      PDMP not reviewed this encounter.   Maranda Jamee Jacob, MD 05/04/24 [phone]

## 2024-05-04 NOTE — ED Triage Notes (Signed)
 Patient c/o possible vertigo that happened today around 2pm.  Patient states she could feel herself tilting and the room was spinning, nausea.  History of vertigo.  Denies any meds.
